# Patient Record
Sex: Male | Born: 1970 | Race: Black or African American | Hispanic: No | Marital: Married | Smoking: Current every day smoker
Health system: Southern US, Community
[De-identification: ages and names within clinical notes are randomized; demographics above are authoritative.]

## PROBLEM LIST (undated history)

## (undated) DIAGNOSIS — F329 Major depressive disorder, single episode, unspecified: Secondary | ICD-10-CM

## (undated) DIAGNOSIS — S92919A Unspecified fracture of unspecified toe(s), initial encounter for closed fracture: Secondary | ICD-10-CM

## (undated) DIAGNOSIS — E785 Hyperlipidemia, unspecified: Secondary | ICD-10-CM

## (undated) DIAGNOSIS — I10 Essential (primary) hypertension: Secondary | ICD-10-CM

## (undated) HISTORY — DX: Major depressive disorder, single episode, unspecified: F32.9

## (undated) HISTORY — DX: Hyperlipidemia, unspecified: E78.5

---

## 2009-01-12 ENCOUNTER — Emergency Department (HOSPITAL_COMMUNITY): Admission: EM | Admit: 2009-01-12 | Discharge: 2009-01-12 | Payer: Self-pay | Admitting: Emergency Medicine

## 2009-01-23 ENCOUNTER — Emergency Department (HOSPITAL_COMMUNITY): Admission: EM | Admit: 2009-01-23 | Discharge: 2009-01-23 | Payer: Self-pay | Admitting: Family Medicine

## 2009-10-11 ENCOUNTER — Emergency Department (HOSPITAL_COMMUNITY): Admission: EM | Admit: 2009-10-11 | Discharge: 2009-10-11 | Payer: Self-pay | Admitting: Family Medicine

## 2009-10-21 ENCOUNTER — Emergency Department (HOSPITAL_COMMUNITY): Admission: EM | Admit: 2009-10-21 | Discharge: 2009-10-21 | Payer: Self-pay | Admitting: Emergency Medicine

## 2009-11-06 ENCOUNTER — Emergency Department (HOSPITAL_COMMUNITY): Admission: EM | Admit: 2009-11-06 | Discharge: 2009-11-06 | Payer: Self-pay | Admitting: Family Medicine

## 2010-05-23 ENCOUNTER — Inpatient Hospital Stay (HOSPITAL_COMMUNITY)
Admission: RE | Admit: 2010-05-23 | Discharge: 2010-05-23 | Disposition: A | Discharge status: Home / Self Care | Payer: Self-pay | Source: Ambulatory Visit | Attending: Emergency Medicine | Admitting: Emergency Medicine

## 2010-05-23 DIAGNOSIS — T148XXA Other injury of unspecified body region, initial encounter: Secondary | ICD-10-CM

## 2010-07-08 LAB — WOUND CULTURE

## 2010-11-08 ENCOUNTER — Ambulatory Visit (INDEPENDENT_AMBULATORY_CARE_PROVIDER_SITE_OTHER): Payer: Self-pay

## 2010-11-08 ENCOUNTER — Inpatient Hospital Stay (INDEPENDENT_AMBULATORY_CARE_PROVIDER_SITE_OTHER)
Admission: RE | Admit: 2010-11-08 | Discharge: 2010-11-08 | Disposition: A | Discharge status: Home / Self Care | Payer: Self-pay | Source: Ambulatory Visit | Attending: Emergency Medicine | Admitting: Emergency Medicine

## 2010-11-08 DIAGNOSIS — L0291 Cutaneous abscess, unspecified: Secondary | ICD-10-CM

## 2010-11-08 DIAGNOSIS — S90129A Contusion of unspecified lesser toe(s) without damage to nail, initial encounter: Secondary | ICD-10-CM

## 2010-11-08 DIAGNOSIS — L0390 Cellulitis, unspecified: Secondary | ICD-10-CM

## 2011-10-17 ENCOUNTER — Emergency Department (HOSPITAL_COMMUNITY): Payer: Self-pay

## 2011-10-17 ENCOUNTER — Encounter (HOSPITAL_COMMUNITY): Payer: Self-pay | Admitting: *Deleted

## 2011-10-17 ENCOUNTER — Emergency Department (HOSPITAL_COMMUNITY)
Admission: EM | Admit: 2011-10-17 | Discharge: 2011-10-17 | Disposition: A | Discharge status: Home / Self Care | Payer: Self-pay | Attending: Emergency Medicine | Admitting: Emergency Medicine

## 2011-10-17 DIAGNOSIS — F17200 Nicotine dependence, unspecified, uncomplicated: Secondary | ICD-10-CM | POA: Insufficient documentation

## 2011-10-17 DIAGNOSIS — W268XXA Contact with other sharp object(s), not elsewhere classified, initial encounter: Secondary | ICD-10-CM | POA: Insufficient documentation

## 2011-10-17 DIAGNOSIS — Z23 Encounter for immunization: Secondary | ICD-10-CM | POA: Insufficient documentation

## 2011-10-17 DIAGNOSIS — S91339A Puncture wound without foreign body, unspecified foot, initial encounter: Secondary | ICD-10-CM

## 2011-10-17 DIAGNOSIS — S91309A Unspecified open wound, unspecified foot, initial encounter: Secondary | ICD-10-CM | POA: Insufficient documentation

## 2011-10-17 MED ORDER — SULFAMETHOXAZOLE-TRIMETHOPRIM 800-160 MG PO TABS: 1.0000 | ORAL_TABLET | Freq: Two times a day (BID) | ORAL | Status: AC

## 2011-10-17 MED ORDER — HYDROCODONE-ACETAMINOPHEN 5-500 MG PO TABS: 1.0000 | ORAL_TABLET | Freq: Four times a day (QID) | ORAL | Status: AC | PRN

## 2011-10-17 MED ORDER — CEPHALEXIN 250 MG PO CAPS: 250.0000 mg | ORAL_CAPSULE | Freq: Four times a day (QID) | ORAL | Status: AC

## 2011-10-17 MED ORDER — SULFAMETHOXAZOLE-TMP DS 800-160 MG PO TABS
1.0000 | ORAL_TABLET | Freq: Once | ORAL | Status: AC
  Administered 2011-10-17: 1 via ORAL
  Filled 2011-10-17: qty 1

## 2011-10-17 MED ORDER — TETANUS-DIPHTH-ACELL PERTUSSIS 5-2.5-18.5 LF-MCG/0.5 IM SUSP
0.5000 mL | Freq: Once | INTRAMUSCULAR | Status: AC
  Administered 2011-10-17: 0.5 mL via INTRAMUSCULAR
  Filled 2011-10-17: qty 0.5

## 2011-10-17 NOTE — ED Notes (Signed)
Pt states he step on nail. Pt states he has swelling in his left foot. Pt denies any other c/o

## 2011-10-17 NOTE — Discharge Instructions (Signed)
Puncture Wound  A puncture wound is an injury that extends through all layers of the skin and into the tissue beneath the skin (subcutaneous tissue). Puncture wounds become infected easily because germs often enter the body and go beneath the skin during the injury. Having a deep wound with a small entrance point makes it difficult for your caregiver to adequately clean the wound. This is especially true if you have stepped on a nail and it has passed through a dirty shoe or other situations where the wound is obviously contaminated.  CAUSES   Many puncture wounds involve glass, nails, splinters, fish hooks, or other objects that enter the skin (foreign bodies). A puncture wound may also be caused by a human bite or animal bite.  DIAGNOSIS   A puncture wound is usually diagnosed by your history and a physical exam. You may need to have an X-ray or an ultrasound to check for any foreign bodies still in the wound.  TREATMENT    Your caregiver will clean the wound as thoroughly as possible. Depending on the location of the wound, a bandage (dressing) may be applied.   Your caregiver might prescribe antibiotic medicines.   You may need a follow-up visit to check on your wound. Follow all instructions as directed by your caregiver.  HOME CARE INSTRUCTIONS    Change your dressing once per day, or as directed by your caregiver. If the dressing sticks, it may be removed by soaking the area in water.   If your caregiver has given you follow-up instructions, it is very important that you return for a follow-up appointment. Not following up as directed could result in a chronic or permanent injury, pain, and disability.   Only take over-the-counter or prescription medicines for pain, discomfort, or fever as directed by your caregiver.   If you are given antibiotics, take them as directed. Finish them even if you start to feel better.  You may need a tetanus shot if:   You cannot remember when you had your last tetanus  shot.   You have never had a tetanus shot.  If you got a tetanus shot, your arm may swell, get red, and feel warm to the touch. This is common and not a problem. If you need a tetanus shot and you choose not to have one, there is a rare chance of getting tetanus. Sickness from tetanus can be serious.  You may need a rabies shot if an animal bite caused your puncture wound.  SEEK MEDICAL CARE IF:    You have redness, swelling, or increasing pain in the wound.   You have red streaks going away from the wound.   You notice a bad smell coming from the wound or dressing.   You have yellowish-white fluid (pus) coming from the wound.   You are treated with an antibiotic for infection, but the infection is not getting better.   You notice something in the wound, such as rubber from your shoe, cloth, or another object.   You have a fever.   You have severe pain.   You have difficulty breathing.   You feel dizzy or faint.   You cannot stop vomiting.   You lose feeling, develop numbness, or cannot move a limb below the wound.   Your symptoms worsen.  MAKE SURE YOU:   Understand these instructions.   Will watch your condition.   Will get help right away if you are not doing well or get worse.    [facility].

## 2011-10-17 NOTE — ED Provider Notes (Signed)
History     CSN: [id number]  Arrival date & time 10/17/11  1818   First MD Initiated Contact with Patient 10/17/11 2136      Chief Complaint  Patient presents with  . Foot Pain    (Consider location/radiation/quality/duration/timing/severity/associated sxs/prior treatment) HPI  She presents to the emergency department with complaints of stepping on a nail earlier today. He states that his foot appears to be swollen therefore he decided to get it evaluated. He denies being a diabetic or denies having problems with stealing. He states that the nail was dirty. He is unsure when his last tetanus shot was. He denies having any nausea, vomiting, diarrhea, weakness, loss of consciousness.  History reviewed. No pertinent past medical history.  History reviewed. No pertinent past surgical history.  No family history on file.  History  Substance Use Topics  . Smoking status: Current Everyday Smoker  . Smokeless tobacco: Not on file  . Alcohol Use: Yes      Review of Systems   HEENT: denies blurry vision or change in hearing PULMONARY: Denies difficulty breathing and SOB CARDIAC: denies chest pain or heart palpitations MUSCULOSKELETAL:  denies being unable to ambulate ABDOMEN AL: denies abdominal pain GU: denies loss of bowel or urinary control NEURO: denies numbness and tingling in extremities      Allergies  Review of patient's allergies indicates no known allergies.  Home Medications   Current Outpatient Rx  Name Route Sig Dispense Refill  . CEPHALEXIN 250 MG PO CAPS Oral Take 1 capsule (250 mg total) by mouth 4 (four) times daily. 28 capsule 0  . HYDROCODONE-ACETAMINOPHEN 5-500 MG PO TABS Oral Take 1 tablet by mouth every 6 (six) hours as needed for pain. 8 tablet 0  . SULFAMETHOXAZOLE-TRIMETHOPRIM 800-160 MG PO TABS Oral Take 1 tablet by mouth every 12 (twelve) hours. 10 tablet 0    BP 149/83  Pulse 77  Temp 98.5 F (36.9 C) (Oral)  Resp 18  Ht 6\' 1"  (1.854  m)  Wt 350 lb (158.759 kg)  BMI 46.18 kg/m2  SpO2 95%  Physical Exam  Nursing note and vitals reviewed. Constitutional: He appears well-developed and well-nourished. No distress.  HENT:  Head: Normocephalic and atraumatic.  Eyes: Pupils are equal, round, and reactive to light.  Neck: Normal range of motion. Neck supple.  Cardiovascular: Normal rate and regular rhythm.   Pulmonary/Chest: Effort normal.  Abdominal: Soft.  Musculoskeletal:       Small puncture wound noted to bottom of patients foot. He does not have any cellulitis surrounding wound or puss when expressed. No foreign body palpated. No deformity. The wound is no longer bleeding. The foot is mildy swollen and mildly tender.  Neurological: He is alert.  Skin: Skin is warm and dry.    ED Course  Procedures (including critical care time)  Labs Reviewed - No data to display Dg Foot Complete Left  10/17/2011  *RADIOLOGY REPORT*  Clinical Data: 41 year old male with penetrating trauma.  Stepped on nail.  Pain.  LEFT FOOT - COMPLETE 3+ VIEW  Comparison: None.  Findings: Bone mineralization is within normal limits.  Chronic, corticated ossific fragment adjacent to the medial malleolus. Calcaneus intact.  Joint spaces preserved.  No acute fracture or dislocation.  No subcutaneous gas. No radiopaque foreign body identified.  IMPRESSION: No radiopaque foreign body identified.  No acute fracture or dislocation identified about the left foot.  Original Report Authenticated By: Harley Hallmark, M.D.     1. Puncture wound  of foot       MDM  pts foot soaked in betadine. Xrays negative for foreign body remaining in foot.Tetanus shot given. Crutches and post op boot. Pt given first dose of abx in ED as well as prescription keflex and bactrim. Pt also given a short course of pain medication. Asked to have wound rechecked by his PCP within the next 3-5 days.  Pt has been advised of the symptoms that warrant their return to the ED. Patient  has voiced understanding and has agreed to follow-up with the PCP or specialist.         Dorthula Matas, PA 10/17/11 2234

## 2011-10-18 NOTE — ED Provider Notes (Signed)
Medical screening examination/treatment/procedure(s) were performed by non-physician practitioner and as supervising physician I was immediately available for consultation/collaboration.    Takari Duncombe L Sian Rockers, MD 10/18/11 0005 

## 2012-05-01 ENCOUNTER — Encounter (HOSPITAL_COMMUNITY): Payer: Self-pay | Admitting: Family Medicine

## 2012-05-01 ENCOUNTER — Emergency Department (HOSPITAL_COMMUNITY)
Admission: EM | Admit: 2012-05-01 | Discharge: 2012-05-01 | Disposition: A | Discharge status: Home / Self Care | Payer: No Typology Code available for payment source | Attending: Emergency Medicine | Admitting: Emergency Medicine

## 2012-05-01 DIAGNOSIS — F17200 Nicotine dependence, unspecified, uncomplicated: Secondary | ICD-10-CM | POA: Insufficient documentation

## 2012-05-01 DIAGNOSIS — S0993XA Unspecified injury of face, initial encounter: Secondary | ICD-10-CM | POA: Insufficient documentation

## 2012-05-01 DIAGNOSIS — S199XXA Unspecified injury of neck, initial encounter: Secondary | ICD-10-CM | POA: Insufficient documentation

## 2012-05-01 DIAGNOSIS — Y92410 Unspecified street and highway as the place of occurrence of the external cause: Secondary | ICD-10-CM | POA: Insufficient documentation

## 2012-05-01 DIAGNOSIS — IMO0002 Reserved for concepts with insufficient information to code with codable children: Secondary | ICD-10-CM | POA: Insufficient documentation

## 2012-05-01 DIAGNOSIS — Y9389 Activity, other specified: Secondary | ICD-10-CM | POA: Insufficient documentation

## 2012-05-01 MED ORDER — NAPROXEN 500 MG PO TABS: 500.0000 mg | ORAL_TABLET | Freq: Two times a day (BID) | ORAL | Status: DC
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
## 2012-05-03 NOTE — ED Provider Notes (Signed)
Medical screening examination/treatment/procedure(s) were performed by non-physician practitioner and as supervising physician I was immediately available for consultation/collaboration.   Suzi Roots, MD 05/03/12 [phone]

## 2012-10-29 ENCOUNTER — Encounter (HOSPITAL_COMMUNITY): Payer: Self-pay | Admitting: Emergency Medicine

## 2012-10-29 ENCOUNTER — Emergency Department (HOSPITAL_COMMUNITY)
Admission: EM | Admit: 2012-10-29 | Discharge: 2012-10-29 | Disposition: A | Discharge status: Home / Self Care | Payer: Self-pay | Attending: Emergency Medicine | Admitting: Emergency Medicine

## 2012-10-29 DIAGNOSIS — R0981 Nasal congestion: Secondary | ICD-10-CM

## 2012-10-29 DIAGNOSIS — J00 Acute nasopharyngitis [common cold]: Secondary | ICD-10-CM | POA: Insufficient documentation

## 2012-10-29 DIAGNOSIS — F17200 Nicotine dependence, unspecified, uncomplicated: Secondary | ICD-10-CM | POA: Insufficient documentation

## 2012-10-29 DIAGNOSIS — J310 Chronic rhinitis: Secondary | ICD-10-CM

## 2012-10-29 DIAGNOSIS — J069 Acute upper respiratory infection, unspecified: Secondary | ICD-10-CM | POA: Insufficient documentation

## 2012-10-29 MED ORDER — DEXAMETHASONE SODIUM PHOSPHATE 10 MG/ML IJ SOLN
8.0000 mg | Freq: Once | INTRAMUSCULAR | Status: AC
  Administered 2012-10-29: 8 mg via INTRAMUSCULAR
  Filled 2012-10-29: qty 1

## 2012-10-29 MED ORDER — FLUTICASONE PROPIONATE 50 MCG/ACT NA SUSP: 2.0000 | Freq: Every day | NASAL | Status: DC

## 2012-10-29 MED ORDER — PREDNISONE 50 MG PO TABS: 50.0000 mg | ORAL_TABLET | Freq: Every day | ORAL | Status: DC

## 2012-10-29 MED ORDER — GUAIFENESIN ER 1200 MG PO TB12: 1.0000 | ORAL_TABLET | Freq: Two times a day (BID) | ORAL | Status: DC

## 2012-10-29 NOTE — ED Provider Notes (Signed)
   History    CSN: [id number] Arrival date & time 10/29/12  1017  First MD Initiated Contact with Patient 10/29/12 1028     Chief Complaint  Patient presents with  . Sinus Problem   (Consider location/radiation/quality/duration/timing/severity/associated sxs/prior Treatment) HPI Patient presents emergency department with nasal congestion for the last week.  Patient, states, that he's tried over-the-counter NyQuil and Alka-Seltzer.  Patient, states he did not get any relief.  Patient denies chest pain, shortness breath, nausea, vomiting, abdominal pain, neck pain, earache, headache, blurred vision, numbness, weakness, or syncope.  Patient, states he's not had any fevers.  Patient, states his symptoms have been constant History reviewed. No pertinent past medical history. History reviewed. No pertinent past surgical history. No family history on file. History  Substance Use Topics  . Smoking status: Current Every Day Smoker  . Smokeless tobacco: Not on file  . Alcohol Use: Yes    Review of Systems All other systems negative except as documented in the HPI. All pertinent positives and negatives as reviewed in the HPI. Allergies  Review of patient's allergies indicates no known allergies.  Home Medications  No current outpatient prescriptions on file. BP 157/100  Pulse 90  Temp(Src) 98.6 F (37 C)  Resp 24  SpO2 99% Physical Exam  Nursing note and vitals reviewed. Constitutional: He is oriented to person, place, and time. He appears well-developed and well-nourished. No distress.  HENT:  Head: Normocephalic and atraumatic.  Right Ear: Tympanic membrane normal.  Left Ear: Tympanic membrane normal.  Nose: Mucosal edema present. No sinus tenderness, nasal deformity or septal deviation. No epistaxis. Right sinus exhibits no maxillary sinus tenderness and no frontal sinus tenderness. Left sinus exhibits no maxillary sinus tenderness and no frontal sinus tenderness.  Mouth/Throat:  Uvula is midline, oropharynx is clear and moist and mucous membranes are normal. No oropharyngeal exudate, posterior oropharyngeal edema, posterior oropharyngeal erythema or tonsillar abscesses.  Eyes: Pupils are equal, round, and reactive to light.  Neck: Normal range of motion. Neck supple.  Cardiovascular: Normal rate, regular rhythm and normal heart sounds.  Exam reveals no gallop and no friction rub.   No murmur heard. Pulmonary/Chest: Effort normal and breath sounds normal. No respiratory distress. He has no wheezes.  Neurological: He is alert and oriented to person, place, and time.  Skin: Skin is warm and dry.    ED Course  Procedures (including critical care time) Patient retreated for URI with rhinitis and nasal congestion.  Patient is advised to return here as needed.  Told to increase his fluid intake, rest as much as possible.  Followup with his primary care Dr. or an urgent care  MDM    Carlyle Dolly, PA-C 10/29/12 1044   Medical screening examination/treatment/procedure(s) were performed by non-physician practitioner and as supervising physician I was immediately available for consultation/collaboration.  Derwood Kaplan, MD 10/30/12 [phone]

## 2012-10-29 NOTE — ED Notes (Signed)
Runny nose and facial pressure for the past few days, thick mucus states that he has facial pressure not getting any better.

## 2012-10-29 NOTE — Progress Notes (Signed)
[facility] has seen patient. Pt stated that he believed he had insurance through his job because they paid for his last ED visit to [facility]. Stated that he did not want to go to [facility] because [facility] was closer. Stated he received a card but did not bring it with him. Provided him with paperwork to send in card information and a list of primary care resources.

## 2013-12-21 DIAGNOSIS — F32A Depression, unspecified: Secondary | ICD-10-CM

## 2013-12-21 HISTORY — DX: Depression, unspecified: F32.A

## 2014-04-29 ENCOUNTER — Ambulatory Visit: Payer: Self-pay | Admitting: Internal Medicine

## 2014-05-17 ENCOUNTER — Ambulatory Visit: Payer: Self-pay | Admitting: Family Medicine

## 2014-06-07 ENCOUNTER — Encounter: Payer: Self-pay | Admitting: Family Medicine

## 2014-06-07 ENCOUNTER — Ambulatory Visit: Payer: Medicaid Other | Attending: Family Medicine | Admitting: Family Medicine

## 2014-06-07 VITALS — BP 131/82 | HR 78 | Temp 98.2°F | Resp 16 | Ht 73.0 in | Wt 311.8 lb

## 2014-06-07 DIAGNOSIS — F1411 Cocaine abuse, in remission: Secondary | ICD-10-CM | POA: Insufficient documentation

## 2014-06-07 DIAGNOSIS — Z6841 Body Mass Index (BMI) 40.0 and over, adult: Secondary | ICD-10-CM

## 2014-06-07 DIAGNOSIS — F1410 Cocaine abuse, uncomplicated: Secondary | ICD-10-CM | POA: Diagnosis not present

## 2014-06-07 DIAGNOSIS — Z113 Encounter for screening for infections with a predominantly sexual mode of transmission: Secondary | ICD-10-CM

## 2014-06-07 DIAGNOSIS — F329 Major depressive disorder, single episode, unspecified: Secondary | ICD-10-CM

## 2014-06-07 DIAGNOSIS — N529 Male erectile dysfunction, unspecified: Secondary | ICD-10-CM

## 2014-06-07 DIAGNOSIS — B356 Tinea cruris: Secondary | ICD-10-CM | POA: Diagnosis not present

## 2014-06-07 DIAGNOSIS — F1210 Cannabis abuse, uncomplicated: Secondary | ICD-10-CM | POA: Diagnosis not present

## 2014-06-07 DIAGNOSIS — Z720 Tobacco use: Secondary | ICD-10-CM

## 2014-06-07 DIAGNOSIS — F17200 Nicotine dependence, unspecified, uncomplicated: Secondary | ICD-10-CM | POA: Diagnosis not present

## 2014-06-07 DIAGNOSIS — F1010 Alcohol abuse, uncomplicated: Secondary | ICD-10-CM | POA: Diagnosis not present

## 2014-06-07 DIAGNOSIS — I10 Essential (primary) hypertension: Secondary | ICD-10-CM

## 2014-06-07 DIAGNOSIS — F1011 Alcohol abuse, in remission: Secondary | ICD-10-CM | POA: Insufficient documentation

## 2014-06-07 DIAGNOSIS — E66813 Obesity, class 3: Secondary | ICD-10-CM | POA: Insufficient documentation

## 2014-06-07 DIAGNOSIS — Z813 Family history of other psychoactive substance abuse and dependence: Secondary | ICD-10-CM | POA: Diagnosis not present

## 2014-06-07 DIAGNOSIS — F32A Depression, unspecified: Secondary | ICD-10-CM

## 2014-06-07 DIAGNOSIS — B351 Tinea unguium: Secondary | ICD-10-CM

## 2014-06-07 DIAGNOSIS — L918 Other hypertrophic disorders of the skin: Secondary | ICD-10-CM | POA: Diagnosis not present

## 2014-06-07 LAB — COMPLETE METABOLIC PANEL WITH GFR
ALBUMIN: 3.8 g/dL (ref 3.5–5.2)
ALT: 36 U/L (ref 0–53)
AST: 24 U/L (ref 0–37)
Alkaline Phosphatase: 62 U/L (ref 39–117)
BUN: 15 mg/dL (ref 6–23)
CALCIUM: 9.1 mg/dL (ref 8.4–10.5)
CHLORIDE: 104 meq/L (ref 96–112)
CO2: 26 meq/L (ref 19–32)
Creat: 1.12 mg/dL (ref 0.50–1.35)
GFR, Est African American: >89 mL/min
GFR, Est Non African American: 80 mL/min
GLUCOSE: 79 mg/dL (ref 70–99)
POTASSIUM: 5.4 meq/L — AB (ref 3.5–5.3)
SODIUM: 139 meq/L (ref 135–145)
Total Bilirubin: 0.2 mg/dL (ref 0.2–1.2)
Total Protein: 6.3 g/dL (ref 6.0–8.3)

## 2014-06-07 LAB — CBC
HCT: 47.4 % (ref 39.0–52.0)
Hemoglobin: 15.9 g/dL (ref 13.0–17.0)
MCH: 28.7 pg (ref 26.0–34.0)
MCHC: 33.5 g/dL (ref 30.0–36.0)
MCV: 85.6 fL (ref 78.0–100.0)
MPV: 9.4 fL (ref 8.6–12.4)
PLATELETS: 247 10*3/uL (ref 150–400)
RBC: 5.54 MIL/uL (ref 4.22–5.81)
RDW: 13.9 % (ref 11.5–15.5)
WBC: 6.2 10*3/uL (ref 4.0–10.5)

## 2014-06-07 LAB — POCT URINALYSIS DIPSTICK
BILIRUBIN UA: NEGATIVE
GLUCOSE UA: NEGATIVE
KETONES UA: NEGATIVE
Leukocytes, UA: NEGATIVE
Nitrite, UA: NEGATIVE
Protein, UA: NEGATIVE
RBC UA: NEGATIVE
SPEC GRAV UA: 1.025
Urobilinogen, UA: 0.2
pH, UA: 7

## 2014-06-07 LAB — LIPID PANEL
CHOL/HDL RATIO: 4.2 ratio
CHOLESTEROL: 195 mg/dL (ref 0–200)
HDL: 46 mg/dL (ref >39)
LDL Cholesterol: 72 mg/dL (ref 0–99)
TRIGLYCERIDES: 387 mg/dL — AB (ref <150)
VLDL: 77 mg/dL — AB (ref 0–40)

## 2014-06-07 LAB — TSH: TSH: 1.158 u[IU]/mL (ref 0.350–4.500)

## 2014-06-07 LAB — POCT GLYCOSYLATED HEMOGLOBIN (HGB A1C): Hemoglobin A1C: 5.6

## 2014-06-07 MED ORDER — SILDENAFIL CITRATE 100 MG PO TABS: 50.0000 mg | ORAL_TABLET | Freq: Every day | ORAL | Status: DC | PRN

## 2014-06-07 NOTE — Assessment & Plan Note (Signed)
A: skin tags  P: f/u for skin tag removals

## 2014-06-07 NOTE — Assessment & Plan Note (Signed)
A: mild rash today P: lamisil for toenails will also cover this

## 2014-06-07 NOTE — Patient Instructions (Signed)
Mr. Leeth,  Thank you for coming in today. It was a pleasure meeting you. I look forward to being your primary doctor.   1. Erectile dysfunction: Normal blood sugar, blood pressure, blood flow  I suspect your dysfunction is due to the following: alcohol abuse, smoking, intermittent cocaine use  Plan: viagra 50-100 mg as needed, look into pfizer pathway or online for coupons assistance with cost if needed  Continue to avoid alcohol and drugs. Continue wellbutrin which is good for depression and smoking cessation.   2. Toe nail fungus and jock itch:  Checking liver function test (CMP) and CBC If normal will order lamisil 250 mg daily for 12 weeks.   3. Skin tags: plan to remove at f/u visit  4 smoker:  Normal heart and lung exam today Please have chest x-ray done at earliest convenience  You will be called with lab and imaging results  F/u in 3-4 weeks for skin tag removal  Dr. Adrian Blackwater

## 2014-06-07 NOTE — Progress Notes (Signed)
Patient reports he has no energy and would like to get his testosterone levels checkes Patient reports depression and is taking medication for it but is not sure the name or dose Patient reports erectile dysfunction Patient also reports "jock itch"

## 2014-06-07 NOTE — Progress Notes (Signed)
   Subjective:    Patient ID: Bruce Koch, male    DOB: 1970/07/18, 44 y.o.   MRN: [id number] CC: establish care, low energy, depression, erectile dysfunction, "jock itch" HPI 44 yo M presents to establish care and discuss the following:  1. Depression: Started in July 2015 after losing his job at [employer]. Patient started drinking more heavily in 10/15 he went on a cocaine binge. He since went to [facility]. He has stopped drinking for 3 weeks. He has smoked cocaine 2-3 times since 10/15. He reports that his neighbor sells it. He is taking Wellbutrin, cannot recall the dose. He thinks he is taking naltrexone as well. He reports depression symptoms have persisted and are mildly improved, to no change. No SI.   2. Substance abuse: ETOH, marijuana and cocaine. Has a strong fam hx of substance abuse and his father died of cocaine overdose. He feels like he needs more treatment than [facility] can offer. He is working to get into [facility].   3. Erectile dysfunction: x 6 months. His wife is complaining. He has difficulty getting and maintaining erection. He is a smoker. Has had elevated BP in the past but is not hypertensive.   4. Rash: in groin area x 10 years.  Very itchy at times. Also gets rash between toes. Has tried creams, ointments and powders. Rash is doing well today.  Soc Hx: current smoker 1/4-1 PPD  Med Hx: ETOH abuse and cocaine abuse Surg hx: negative  Review of Systems As per HPI GAD-7: score of 14. 1-1,4,5. 2-2. 3-3,6,7.     Objective:   Physical Exam BP 131/82 mmHg  Pulse 78  Temp(Src) 98.2 F (36.8 C)  Resp 16  Ht 6' 1"  (1.854 m)  Wt 311 lb 12.8 oz (141.432 kg)  BMI 41.15 kg/m2  SpO2 96% General appearance: alert, cooperative, no distress and morbidly obese Head: Normocephalic, without obvious abnormality, atraumatic Eyes: conjunctivae/corneas clear. PERRL, EOM's intact. Ears: normal TM's and external ear canals both ears Nose: no discharge, turbinates  pink, swollen Throat: lips, mucosa, and tongue normal; fair dentition, gums normal  Neck: no adenopathy and thyroid not enlarged, symmetric, no tenderness/mass/nodules Lungs: clear to auscultation bilaterally Heart: regular rate and rhythm, S1, S2 normal, no murmur, click, rub or gallop Male genitalia:circumcised, no skin lesions, normal testes, no hernias. 2+ femoral pulses.  Extremities: extremities normal, atraumatic, no cyanosis or edema Skin: atrophic skin in groin area, dry skin between toes. Thickened and yellow toes on L foot 1st and 5th as well as R foot 1st, 2nd, 5th. Skin tags under arms and around neck.   Lab Results  Component Value Date   HGBA1C 5.60 06/07/2014   UA: wnl      Assessment & Plan:

## 2014-06-07 NOTE — Assessment & Plan Note (Signed)
Screening HIV and RPR

## 2014-06-07 NOTE — Assessment & Plan Note (Signed)
A: ED due to substance abuse and depression most likely  P: Smoking cessation Avoid ETOH and cocaine  viagra

## 2014-06-07 NOTE — Assessment & Plan Note (Signed)
A: toenail fungus P: CMP, CBC, lamisil x 12 week course if labs wnl

## 2014-06-07 NOTE — Assessment & Plan Note (Signed)
A: depressed mood and polysubstance abuse P: Patient advised to continue antidepressant and care under therapy. He may go to [place] or [place] for additional services if needed

## 2014-06-08 LAB — RPR

## 2014-06-08 LAB — HIV ANTIBODY (ROUTINE TESTING W REFLEX): HIV: NONREACTIVE

## 2014-06-08 LAB — TESTOSTERONE: TESTOSTERONE: 274 ng/dL — AB (ref 300–890)

## 2014-06-09 ENCOUNTER — Telehealth: Payer: Self-pay | Admitting: *Deleted

## 2014-06-09 NOTE — Telephone Encounter (Signed)
LVM to return call.

## 2014-06-09 NOTE — Telephone Encounter (Signed)
-----   Message from Minerva Ends, MD sent at 06/08/2014  2:31 PM EST ----- Slightly low testosterone level. High non-fasting Tgs, will repeat fasting, next visit Slight elevated K+ with normal Cr (kidney function) no w/u needed at this time

## 2014-06-24 ENCOUNTER — Ambulatory Visit: Payer: Medicaid Other

## 2014-07-11 ENCOUNTER — Ambulatory Visit: Payer: Medicaid Other | Admitting: Family Medicine

## 2014-07-14 ENCOUNTER — Ambulatory Visit: Payer: Medicaid Other | Admitting: Family Medicine

## 2014-08-09 ENCOUNTER — Encounter: Payer: Self-pay | Admitting: Family Medicine

## 2014-08-09 ENCOUNTER — Ambulatory Visit: Payer: Medicaid Other | Attending: Family Medicine | Admitting: Family Medicine

## 2014-08-09 VITALS — BP 145/95 | HR 78 | Temp 98.4°F | Ht 73.0 in | Wt 308.0 lb

## 2014-08-09 DIAGNOSIS — N529 Male erectile dysfunction, unspecified: Secondary | ICD-10-CM | POA: Insufficient documentation

## 2014-08-09 DIAGNOSIS — K219 Gastro-esophageal reflux disease without esophagitis: Secondary | ICD-10-CM | POA: Diagnosis not present

## 2014-08-09 DIAGNOSIS — B356 Tinea cruris: Secondary | ICD-10-CM | POA: Diagnosis not present

## 2014-08-09 DIAGNOSIS — B353 Tinea pedis: Secondary | ICD-10-CM | POA: Diagnosis not present

## 2014-08-09 DIAGNOSIS — R350 Frequency of micturition: Secondary | ICD-10-CM | POA: Insufficient documentation

## 2014-08-09 LAB — POCT URINALYSIS DIPSTICK
Bilirubin, UA: NEGATIVE
GLUCOSE UA: NEGATIVE
Ketones, UA: NEGATIVE
Leukocytes, UA: NEGATIVE
Nitrite, UA: NEGATIVE
PH UA: 5.5
PROTEIN UA: NEGATIVE
RBC UA: NEGATIVE
SPEC GRAV UA: 1.025
UROBILINOGEN UA: 0.2

## 2014-08-09 MED ORDER — PRAZOSIN HCL 1 MG PO CAPS: 1.0000 mg | ORAL_CAPSULE | Freq: Two times a day (BID) | ORAL | Status: DC

## 2014-08-09 MED ORDER — TADALAFIL 20 MG PO TABS: 10.0000 mg | ORAL_TABLET | ORAL | Status: DC | PRN

## 2014-08-09 MED ORDER — PANTOPRAZOLE SODIUM 40 MG PO TBEC: 40.0000 mg | DELAYED_RELEASE_TABLET | Freq: Every day | ORAL | Status: DC

## 2014-08-09 MED ORDER — TERBINAFINE HCL 250 MG PO TABS: 250.0000 mg | ORAL_TABLET | Freq: Every day | ORAL | Status: DC

## 2014-08-09 NOTE — Assessment & Plan Note (Signed)
A: Frequent urination: suspect BPH. Patient declined prostate exam. Normal UA. Slightly elevated BP P: Avoid liquids after 9 AM Avoid sugar sweetened drinks Prazosin 1 mg twice daily to help reduce prostate size. It will also lower your BP a bit.

## 2014-08-09 NOTE — Assessment & Plan Note (Signed)
Sexual dysfunction:  Try cialis since viagra was too expensive  Regular exercise  Low carb diet with focus on lean protein

## 2014-08-09 NOTE — Assessment & Plan Note (Signed)
Fungal infection in groin and feet: Lamisil 250 mg daily for 12 weeks

## 2014-08-09 NOTE — Assessment & Plan Note (Addendum)
Fungal infection in groin and feet: Lamisil 250 mg daily for 12 weeks

## 2014-08-09 NOTE — Patient Instructions (Signed)
Mr. Rouillard,  Thank you for coming in today.  1. GERD Take Protonix 30 minutes before meals   2. Fungal infection in groin and feet: Lamisil 250 mg daily for 12 weeks  3. Frequent urination: Avoid liquids after 9 AM Avoid sugar sweetened drinks Prazosin 1 mg twice daily to help reduce prostate size. It will also lower your BP a bit.  4. Sexual dysfunction:  Try cialis since viagra was too expensive  Regular exercise  Low carb diet with focus on lean protein  F/u in 3 months for fungus  Dr. Adrian Blackwater  Food Choices for Gastroesophageal Reflux Disease When you have gastroesophageal reflux disease (GERD), the foods you eat and your eating habits are very important. Choosing the right foods can help ease the discomfort of GERD. WHAT GENERAL GUIDELINES DO I NEED TO FOLLOW?  Choose fruits, vegetables, whole grains, low-fat dairy products, and low-fat meat, fish, and poultry.  Limit fats such as oils, salad dressings, butter, nuts, and avocado.  Keep a food diary to identify foods that cause symptoms.  Avoid foods that cause reflux. These may be different for different people.  Eat frequent small meals instead of three large meals each day.  Eat your meals slowly, in a relaxed setting.  Limit fried foods.  Cook foods using methods other than frying.  Avoid drinking alcohol.  Avoid drinking large amounts of liquids with your meals.  Avoid bending over or lying down until 2-3 hours after eating. WHAT FOODS ARE NOT RECOMMENDED? The following are some foods and drinks that may worsen your symptoms: Vegetables Tomatoes. Tomato juice. Tomato and spaghetti sauce. Chili peppers. Onion and garlic. Horseradish. Fruits Oranges, grapefruit, and lemon (fruit and juice). Meats High-fat meats, fish, and poultry. This includes hot dogs, ribs, ham, sausage, salami, and bacon. Dairy Whole milk and chocolate milk. Sour cream. Cream. Butter. Ice cream. Cream cheese.  Beverages Coffee and  tea, with or without caffeine. Carbonated beverages or energy drinks. Condiments Hot sauce. Barbecue sauce.  Sweets/Desserts Chocolate and cocoa. Donuts. Peppermint and spearmint. Fats and Oils High-fat foods, including [place] fries and potato chips. Other Vinegar. Strong spices, such as black pepper, white pepper, red pepper, cayenne, curry powder, cloves, ginger, and chili powder. The items listed above may not be a complete list of foods and beverages to avoid. Contact your dietitian for more information. Document Released: 04/08/2005 Document Revised: 04/13/2013 Document Reviewed: 02/10/2013 [facility] Patient Information 2015 [place], [place]. This information is not intended to replace advice given to you by your health care provider. Make sure you discuss any questions you have with your health care provider.

## 2014-08-09 NOTE — Progress Notes (Signed)
   Subjective:    Patient ID: Bruce Koch, male    DOB: [birth year], 44 y.o.   MRN: [id number] CC: GERD, urinary frequency, tinea pedis and cruris  HPI  1. GERD: eating cake and cereal late at night. Having reflux. Burning sensation. Eats at night because he does not feel full.  2. Foot and groin fungus: tried OTC creams. No improvement. Has a lot of itching in groin and between toes.   3. Urinary frequency: 2-3 times per night wakes up to urinate. Drinks a lot of liquid mostly milk, juice, soda during the evening. ETOH once a week only.   4. Erectile dysfunction: has slightly low testosterone. Could no afford viagra. Would like to try another medication.   Soc Hx: current smoker 1/4 PPD. ETOH 1 x per week. No cocaine.  Review of Systems  Constitutional: Negative for fever.  Gastrointestinal: Positive for nausea, vomiting and abdominal pain.  Endocrine: Positive for polyuria.  Genitourinary: Positive for frequency. Negative for dysuria and flank pain.      Objective:   Physical Exam BP 145/95 mmHg  Pulse 78  Temp(Src) 98.4 F (36.9 C) (Oral)  Ht 6' 1"  (1.854 m)  Wt 308 lb (139.708 kg)  BMI 40.64 kg/m2  SpO2 98%  BP Readings from Last 3 Encounters:  08/09/14 145/95  06/07/14 131/82  10/29/12 147/85   Wt Readings from Last 3 Encounters:  08/09/14 308 lb (139.708 kg)  06/07/14 311 lb 12.8 oz (141.432 kg)  10/17/11 350 lb (158.759 kg)  General appearance: alert, cooperative, no distress and morbidly obese Abdomen: soft, non-tender; bowel sounds normal; no masses,  no organomegaly Male genitalia: normal  Rectal: patient refused  Extremities: fissuring and thickening of skin between toes   Lab Results  Component Value Date   HGBA1C 5.60 06/07/2014   UA: normal     Assessment & Plan:

## 2014-08-09 NOTE — Assessment & Plan Note (Signed)
A: GERD P: Take Protonix 30 minutes before meals  GERD diet

## 2014-08-09 NOTE — Progress Notes (Signed)
Patient complains of abdominal pain for 2-3 weeks.  Frequent bathroom trips, gas mostly. Patient also having trouble with skin irritation/jock itch.  Some odor.

## 2014-09-09 ENCOUNTER — Ambulatory Visit: Payer: Medicaid Other

## 2014-09-14 ENCOUNTER — Encounter (HOSPITAL_COMMUNITY): Payer: Self-pay | Admitting: Emergency Medicine

## 2014-09-14 ENCOUNTER — Emergency Department (HOSPITAL_COMMUNITY)
Admission: EM | Admit: 2014-09-14 | Discharge: 2014-09-14 | Disposition: A | Discharge status: Home / Self Care | Payer: Medicaid Other | Attending: Emergency Medicine | Admitting: Emergency Medicine

## 2014-09-14 DIAGNOSIS — Z79899 Other long term (current) drug therapy: Secondary | ICD-10-CM | POA: Insufficient documentation

## 2014-09-14 DIAGNOSIS — Z720 Tobacco use: Secondary | ICD-10-CM | POA: Diagnosis not present

## 2014-09-14 DIAGNOSIS — Z8659 Personal history of other mental and behavioral disorders: Secondary | ICD-10-CM | POA: Insufficient documentation

## 2014-09-14 DIAGNOSIS — H578 Other specified disorders of eye and adnexa: Secondary | ICD-10-CM | POA: Insufficient documentation

## 2014-09-14 DIAGNOSIS — H579 Unspecified disorder of eye and adnexa: Secondary | ICD-10-CM

## 2014-09-14 MED ORDER — FLUORESCEIN SODIUM 1 MG OP STRP
1.0000 | ORAL_STRIP | Freq: Once | OPHTHALMIC | Status: AC
  Administered 2014-09-14: 1 via OPHTHALMIC
  Filled 2014-09-14: qty 1

## 2014-09-14 MED ORDER — TETRACAINE HCL 0.5 % OP SOLN
1.0000 [drp] | Freq: Once | OPHTHALMIC | Status: AC
  Administered 2014-09-14: 1 [drp] via OPHTHALMIC
  Filled 2014-09-14: qty 2

## 2014-09-14 NOTE — Discharge Instructions (Signed)
Eye, Foreign Body sensation You can use normal saline eye drops.  The term foreign body refers to any object near, on the surface of or in the eye that should not be there. A foreign body may be a small speck of dirt or dust, a hair or eyelash, a splinter or any object. CAUSES  Foreign bodies can get in the eye by:  Flying pieces of something that was broken or destroyed (debris).  A sudden injury (trauma) to the eye. SYMPTOMS  Symptoms depend on what the foreign body is and where it is in the eye. The most common locations are:  On the inner surface of the upper or lower eyelids or on the covering of the white part of the eye (conjunctiva). Symptoms in this location are:  Irritating and painful, especially when blinking.  Feeling like something is in the eye.  On the surface of the clear covering on the front of the eye (cornea). A corneal foreign body has symptoms that:  Are painful and irritating since the cornea is very sensitive.  Form small "rust rings" around a metallic foreign body. Metallic foreign bodies stick more firmly to the surface of the cornea.  Inside the eyeball. Infection can happen fast and can be hard to treat with antibiotics. This is an extremely dangerous situation. Foreign bodies inside the eye can threaten vision. A person may even loose their eye. Foreign bodies inside the eye may cause:  Great pain.  Immediate loss of vision. DIAGNOSIS  Foreign bodies are found during an exam by an eye specialist. Those that are on the eyelids, conjunctiva or cornea are usually (but not always) easily found. When a foreign body is inside the eyeball, a cataract may form almost right away. This makes it hard for an ophthalmologist to find the foreign body. Special tests may be needed, including ultrasound testing, X-rays and CT scans. TREATMENT   Foreign bodies that are on the eyelids, conjunctiva or cornea are often removed easily and painlessly.  If the foreign body  has caused a scratch or abrasion of the cornea, antibiotic drops, ointments and/or a tight patch called a "pressure patch" may be needed. Follow-up exams will be needed for several days until the abrasion heals.  Surgery is needed right away if the foreign body is inside the eyeball. This is a medical emergency. An antibiotic therapy will likely be given to stop an infection. HOME CARE INSTRUCTIONS  The use of eye patches is not universal. Their use varies from state to state and from caregiver to caregiver. If an eye patch was applied:  Keep the eye patch on for as long as directed by your caregiver until the follow-up appointment.  Do not remove the patch to put in medications unless instructed to do so. When replacing the patch, retape it as it was before. Follow the same procedure if the patch becomes loose.  WARNING: Do not drive or operate machinery while the eye is patched. The ability to judge distances will be impaired.  Only take over-the-counter or prescription medicines for pain, discomfort or fever as directed by the caregiver. If no eye patch was applied:  Keep the eye closed as much as possible. Do not rub the eye.  Wear dark glasses as needed to protect the eyes from bright light.  Do not wear contact lenses until the eye feels normal again, or as instructed.  Wear protective eye covering if there is a risk of eye injury. This is important when working  with high speed tools.  Only take over-the-counter or prescription medicines for pain, discomfort or fever as directed by the caregiver. SEEK IMMEDIATE MEDICAL CARE IF:   Pain increases in the eye or the vision changes.  You or your child has problems with the eye patch.  The injury to the eye appears to be getting larger.  There is discharge from the injured eye.  Swelling and/or soreness (inflammation) develops around the affected eye.  You or your child has an oral temperature above 102 F (38.9 C), not  controlled by medicine.  Your baby is older than 3 months with a rectal temperature of 102 F (38.9 C) or higher.  Your baby is 60 months old or younger with a rectal temperature of 100.4 F (38 C) or higher. MAKE SURE YOU:   Understand these instructions.  Will watch your condition.  Will get help right away if you are not doing well or get worse. Document Released: 04/08/2005 Document Revised: 07/01/2011 Document Reviewed: 09/03/2012 [facility] Patient Information 2015 [place], [place]. This information is not intended to replace advice given to you by your health care provider. Make sure you discuss any questions you have with your health care provider.

## 2014-09-14 NOTE — ED Notes (Signed)
Tetracaine and fluorescein at bedside for EDPA

## 2014-09-14 NOTE — ED Notes (Signed)
Pt A+Ox4, reports was mowing the grass yesterday and started having a R eye problem after.  C/o pain, swelling to area "something in my eyelid".  6/10.  +swelling noted.  Skin otherwise PWD.  MAEI.  Speaking full/clear sentences, rr even/un-lab.  Ambulatory with steady gait.  NAD.

## 2014-09-14 NOTE — ED Provider Notes (Signed)
CSN: [id number]     Arrival date & time 09/14/14  1133 History  This chart was scribed for non-physician practitioner Waynetta Pean, PA-C, working with Dorie Rank, MD, by Thea Alken, ED Scribe. This patient was seen in room WTR7/WTR7 and the patient's care was started at 12:00 PM.   Chief Complaint  Patient presents with  . Eye Problem    "was moving the grass yesterday and something got in my eye"   The history is provided by the patient. No language interpreter was used.   Braison Snoke is a 44 y.o. male who presents to the Emergency Department complaining right eye irritation onset yesterday. Pt states he was mowing his lawn yesterday when a possible foreign body flew into his right eye. He reports irritation that began later that evening with some teary watery drainage. He now has 6/10 right eye pain, with the sensation of a localized foreign body under right eye lid. Pt has tried visine with minimal relief as well as rubbing his eye. Pt denies wearing contacts or glasses. He denies blurry vision.   Past Medical History  Diagnosis Date  . Depression 12/2013   History reviewed. No pertinent past surgical history. Family History  Problem Relation Age of Onset  . Diabetes Father   . Drug abuse Father     cocaine overdose   . Alcohol abuse Mother     history   . Alcohol abuse Sister   . Alcohol abuse Brother    History  Substance Use Topics  . Smoking status: Current Every Day Smoker -- 0.25 packs/day  . Smokeless tobacco: Never Used  . Alcohol Use: Yes     Comment: 1/5 Vodka per week     Review of Systems  Constitutional: Negative for fever.  HENT: Negative for ear pain.   Eyes: Positive for pain, discharge and redness. Negative for photophobia, itching and visual disturbance.   Allergies  Review of patient's allergies indicates no known allergies.  Home Medications   Prior to Admission medications   Medication Sig Start Date End Date Taking? Authorizing Provider   pantoprazole (PROTONIX) 40 MG tablet Take 1 tablet (40 mg total) by mouth daily before supper. 08/09/14   Josalyn Funches, MD  prazosin (MINIPRESS) 1 MG capsule Take 1 capsule (1 mg total) by mouth 2 (two) times daily. 08/09/14   Josalyn Funches, MD  tadalafil (CIALIS) 20 MG tablet Take 0.5-1 tablets (10-20 mg total) by mouth every other day as needed for erectile dysfunction. 08/09/14   Josalyn Funches, MD  terbinafine (LAMISIL) 250 MG tablet Take 1 tablet (250 mg total) by mouth daily. For 12 weeks 08/09/14   Josalyn Funches, MD   BP 143/83 mmHg  Pulse 76  Temp(Src) 98.4 F (36.9 C) (Oral)  Resp 16  SpO2 96% Physical Exam  Constitutional: He appears well-developed and well-nourished. No distress.  HENT:  Head: Normocephalic and atraumatic.  Right Ear: External ear normal.  Left Ear: External ear normal.  Eyes: EOM are normal. Pupils are equal, round, and reactive to light. Lids are everted and swept, no foreign bodies found. Right eye exhibits discharge. Right eye exhibits no chemosis. No foreign body present in the right eye. Left eye exhibits no chemosis and no discharge. Right conjunctiva is injected ( mild). Right conjunctiva has no hemorrhage. Left conjunctiva is not injected. Left conjunctiva has no hemorrhage. Right eye exhibits normal extraocular motion. Left eye exhibits normal extraocular motion.  Slit lamp exam:      The right  eye shows no corneal abrasion, no corneal flare and no foreign body.  Right eye tearing noted initially. Right eye conjunctival injection. I anesthetized with tetracaine and stained with fluorescein. No sign of corneal abrasion. Right lids are inverted and swept and no foreign body noted. Right eye flushed with normal saline.   Neck: Neck supple.  Cardiovascular: Normal rate.   Pulmonary/Chest: Effort normal. No respiratory distress.  Musculoskeletal: Normal range of motion.  Neurological: He is alert. Coordination normal.  Skin: Skin is warm and dry. He is  not diaphoretic.  Psychiatric: He has a normal mood and affect. His behavior is normal.  Nursing note and vitals reviewed.   ED Course  Procedures  DIAGNOSTIC STUDIES: Oxygen Saturation is 96% on RA, normal by my interpretation.    COORDINATION OF CARE: 12:10 PM- Pt advised of plan for treatment and pt agrees.  Labs Review Labs Reviewed - No data to display  Imaging Review No results found.   EKG Interpretation None      Filed Vitals:   09/14/14 1135  BP: 143/83  Pulse: 76  Temp: 98.4 F (36.9 C)  TempSrc: Oral  Resp: 16  SpO2: 96%     MDM   Meds given in ED:  Medications  fluorescein ophthalmic strip 1 strip (1 strip Right Eye Given 09/14/14 1219)  tetracaine (PONTOCAINE) 0.5 % ophthalmic solution 1 drop (1 drop Right Eye Given 09/14/14 1219)    Discharge Medication List as of 09/14/2014 12:23 PM      Final diagnoses:  Sensation of foreign body in eye   This is a 44 year old male who presented to the emergency department complaining of a foreign body sensation in his right eye after mowing the lawn yesterday. Patient's eye stained with fluorescein and no evidence of corneal abrasion noted. Patient's right eye was flushed with normal saline and lids were inverted and swept her foreign bodies. No foreign body was found. Patient reports much relief after normal saline flush. I advised the patient to use normal saline eyedrops at home as needed for continued pain and to follow-up with ophthalmology if he has continued problems. I advised the patient to follow-up with their primary care provider this week. I advised the patient to return to the emergency department with new or worsening symptoms or new concerns. The patient verbalized understanding and agreement with plan.    I personally performed the services described in this documentation, which was scribed in my presence. The recorded information has been reviewed and is accurate.     Waynetta Pean,  PA-C 09/14/14 1727  Dorie Rank, MD 09/15/14 [phone]

## 2014-11-01 ENCOUNTER — Ambulatory Visit: Payer: Medicaid Other | Admitting: Family Medicine

## 2015-01-06 ENCOUNTER — Encounter (HOSPITAL_COMMUNITY): Payer: Self-pay | Admitting: *Deleted

## 2015-01-06 ENCOUNTER — Emergency Department (HOSPITAL_COMMUNITY)
Admission: EM | Admit: 2015-01-06 | Discharge: 2015-01-06 | Disposition: A | Discharge status: Home / Self Care | Payer: Medicaid Other | Attending: Emergency Medicine | Admitting: Emergency Medicine

## 2015-01-06 DIAGNOSIS — R51 Headache: Secondary | ICD-10-CM | POA: Diagnosis present

## 2015-01-06 DIAGNOSIS — F329 Major depressive disorder, single episode, unspecified: Secondary | ICD-10-CM | POA: Diagnosis not present

## 2015-01-06 DIAGNOSIS — J029 Acute pharyngitis, unspecified: Secondary | ICD-10-CM | POA: Insufficient documentation

## 2015-01-06 DIAGNOSIS — Z720 Tobacco use: Secondary | ICD-10-CM | POA: Insufficient documentation

## 2015-01-06 DIAGNOSIS — B029 Zoster without complications: Secondary | ICD-10-CM | POA: Diagnosis not present

## 2015-01-06 DIAGNOSIS — Z79899 Other long term (current) drug therapy: Secondary | ICD-10-CM | POA: Diagnosis not present

## 2015-01-06 MED ORDER — TETRACAINE HCL 0.5 % OP SOLN
2.0000 [drp] | Freq: Once | OPHTHALMIC | Status: AC
  Administered 2015-01-06: 2 [drp] via OPHTHALMIC
  Filled 2015-01-06: qty 2

## 2015-01-06 MED ORDER — ACYCLOVIR 200 MG PO CAPS
400.0000 mg | ORAL_CAPSULE | Freq: Once | ORAL | Status: AC
  Administered 2015-01-06: 400 mg via ORAL
  Filled 2015-01-06: qty 2

## 2015-01-06 MED ORDER — IBUPROFEN 400 MG PO TABS
800.0000 mg | ORAL_TABLET | Freq: Once | ORAL | Status: AC
  Administered 2015-01-06: 800 mg via ORAL
  Filled 2015-01-06: qty 2

## 2015-01-06 MED ORDER — ACYCLOVIR 400 MG PO TABS: 400.0000 mg | ORAL_TABLET | Freq: Four times a day (QID) | ORAL | Status: DC

## 2015-01-06 MED ORDER — OXYCODONE-ACETAMINOPHEN 5-325 MG PO TABS: 2.0000 | ORAL_TABLET | ORAL | Status: DC | PRN

## 2015-01-06 MED ORDER — FLUORESCEIN SODIUM 1 MG OP STRP
1.0000 | ORAL_STRIP | Freq: Once | OPHTHALMIC | Status: AC
  Administered 2015-01-06: 1 via OPHTHALMIC
  Filled 2015-01-06: qty 1

## 2015-01-06 NOTE — ED Notes (Signed)
Rona Ravens, PA at bedside.

## 2015-01-06 NOTE — Discharge Instructions (Signed)

## 2015-01-06 NOTE — ED Provider Notes (Signed)
CSN: [id number]     Arrival date & time 01/06/15  2100 History  This chart was scribed for non-physician practitioner Domenic Moras, PA-C working with Ripley Fraise, MD by Meriel Pica, ED Scribe. This patient was seen in room TR11C/TR11C and the patient's care was started at 9:15 PM.    Chief Complaint  Patient presents with  . Headache  . Rash  . Sore Throat   The history is provided by the patient. No language interpreter was used.   HPI Comments: Bruce Koch is a 44 y.o. male who presents to the Emergency Department complaining of a sudden onset, progressively worsening, pruritic and erythematous rash to left side of face that is painful onset 1 day ago. He reports an associated left-sided headache and left-sided sore throat. Pt has been taking ibuprofen with mild to no relief. Sick contacts at home; children have URI. Denies a history of shingles, pain with eye movement or vision problems, dental pain, CP, or SOB.   Past Medical History  Diagnosis Date  . Depression 12/2013   History reviewed. No pertinent past surgical history. Family History  Problem Relation Age of Onset  . Diabetes Father   . Drug abuse Father     cocaine overdose   . Alcohol abuse Mother     history   . Alcohol abuse Sister   . Alcohol abuse Brother    Social History  Substance Use Topics  . Smoking status: Current Every Day Smoker -- 0.25 packs/day  . Smokeless tobacco: Never Used  . Alcohol Use: Yes     Comment: rarely     Review of Systems  HENT: Positive for sore throat ( left-sided). Negative for dental problem.   Eyes: Negative for photophobia, pain and visual disturbance.  Respiratory: Negative for shortness of breath.   Cardiovascular: Negative for chest pain.  Skin: Positive for rash ( left side of face).  Neurological: Positive for headaches ( left-sided).    Allergies  Review of patient's allergies indicates no known allergies.  Home Medications   Prior to Admission  medications   Medication Sig Start Date End Date Taking? Authorizing Provider  pantoprazole (PROTONIX) 40 MG tablet Take 1 tablet (40 mg total) by mouth daily before supper. 08/09/14   Josalyn Funches, MD  prazosin (MINIPRESS) 1 MG capsule Take 1 capsule (1 mg total) by mouth 2 (two) times daily. 08/09/14   Josalyn Funches, MD  tadalafil (CIALIS) 20 MG tablet Take 0.5-1 tablets (10-20 mg total) by mouth every other day as needed for erectile dysfunction. 08/09/14   Josalyn Funches, MD  terbinafine (LAMISIL) 250 MG tablet Take 1 tablet (250 mg total) by mouth daily. For 12 weeks 08/09/14   Josalyn Funches, MD   BP 140/97 mmHg  Pulse 96  Temp(Src) 98.9 F (37.2 C) (Oral)  Resp 20  Ht 6' 1"  (1.854 m)  Wt 275 lb (124.739 kg)  BMI 36.29 kg/m2  SpO2 98% Physical Exam  Constitutional: He appears well-developed and well-nourished. No distress.  HENT:  Head: Normocephalic.  Right Ear: External ear normal.  Left Ear: External ear normal.  Mouth/Throat: Oropharynx is clear and moist. No oropharyngeal exudate.  Multiple vesicular lesions with erythematous base on the left side of face involving left side of nose and spread towards the left temple; ulceration noted to the left upper soft and hard palate of mouth; uvula is midline, no tonsillar swelling or exudates.    Eyes: Conjunctivae, EOM and lids are normal. Pupils are equal, round, and  reactive to light. Lids are everted and swept, no foreign bodies found. Left eye exhibits no chemosis, no discharge, no exudate and no hordeolum. No foreign body present in the left eye. Left conjunctiva is not injected. Left conjunctiva has no hemorrhage. No scleral icterus. Left eye exhibits normal extraocular motion.  Slit lamp exam:      The left eye shows no corneal abrasion, no corneal flare, no corneal ulcer, no foreign body, no hyphema, no hypopyon and no fluorescein uptake.  Neck: No JVD present.  No neck swelling.   Pulmonary/Chest: Effort normal. No  respiratory distress.  Lymphadenopathy:    He has cervical adenopathy.  Neurological: He is alert. Coordination normal.  Skin: Skin is warm. No rash noted. No erythema. No pallor.  Psychiatric: He has a normal mood and affect. His behavior is normal.  Nursing note and vitals reviewed.   ED Course  Procedures  DIAGNOSTIC STUDIES: Oxygen Saturation is 98% on RA, normal by my interpretation.    COORDINATION OF CARE: 9:20 PM Discussed treatment plan which includes to order visual acuity screening with pt. Will consult with attending Dr. Christy Gentles. Pt acknowledges and agrees to plan. Visual acuity: Bilateral 20/25, R 20/25, L 20/50.  9:42 PM Will consult with ophthalmology and perform eye exam.  9:55 PM Eye exam performed, no dendritic lesion or corneal ulcer concerning for ophthalmic herpes zoster.  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  During examination, while talking with pt he admits to being under more stress than normal, rating his stress level 10/10, and stating he is going through a divorce and is trying to get custody of his children. Will prescribe antiviral medication and have pt follow up with PCP. Will give ophthalmology referral. Pt acknowledges and agrees to plan.   10:18 PM Appreciate consultation from ophthalmologist Dr. Posey Pronto who recommend no treatment at this time as there are no eye involvement.  Pt can f/u in office if he develop eye problem.  Recommend f/u with PCP.  10:36 PM Pt also request for a left knee sleeve due to having recurrent pain to L knee. On exam, mild tenderness noted to medial and latera joint line without crepitus or deformity.  FROM.  Sensation intact, no evidence of septic joint.  Knee sleeve provided.  MDM   Final diagnoses:  Shingles outbreak    BP 140/97 mmHg  Pulse 96  Temp(Src) 98.9 F (37.2 C) (Oral)  Resp 20  Ht 6' 1"  (1.854 m)  Wt 275 lb (124.739 kg)  BMI 36.29 kg/m2  SpO2 98%   I personally performed the services  described in this documentation, which was scribed in my presence. The recorded information has been reviewed and is accurate.     Domenic Moras, PA-C 01/06/15 2219  Domenic Moras, PA-C 01/06/15 2220  Domenic Moras, PA-C 01/06/15 1740  Ripley Fraise, MD 01/07/15 0040

## 2015-01-06 NOTE — ED Notes (Signed)
Patient presents stating his children were sick and now he is feeling bad.  C/O HEADACHE, RASH AND SORE THROAT  Has been taking Ibuprofen for the symptoms

## 2015-01-06 NOTE — ED Provider Notes (Signed)
Patient seen/examined in the Emergency Department in conjunction with [facility] Veney  Patient reports rash to left face Exam : pt appears to have rash to left face, c/w zoster.  It extends to tip of nose Plan: will need anti-virals and eye evaluation   Ripley Fraise, MD 01/06/15 2136

## 2015-04-19 ENCOUNTER — Encounter (HOSPITAL_COMMUNITY): Payer: Self-pay | Admitting: *Deleted

## 2015-04-19 ENCOUNTER — Emergency Department (HOSPITAL_COMMUNITY)
Admission: EM | Admit: 2015-04-19 | Discharge: 2015-04-19 | Disposition: A | Discharge status: Home / Self Care | Payer: Medicaid Other | Attending: Emergency Medicine | Admitting: Emergency Medicine

## 2015-04-19 DIAGNOSIS — Z79899 Other long term (current) drug therapy: Secondary | ICD-10-CM | POA: Insufficient documentation

## 2015-04-19 DIAGNOSIS — Z008 Encounter for other general examination: Secondary | ICD-10-CM | POA: Diagnosis present

## 2015-04-19 DIAGNOSIS — F1410 Cocaine abuse, uncomplicated: Secondary | ICD-10-CM | POA: Insufficient documentation

## 2015-04-19 DIAGNOSIS — F1910 Other psychoactive substance abuse, uncomplicated: Secondary | ICD-10-CM

## 2015-04-19 NOTE — ED Provider Notes (Signed)
CSN: [id number]     Arrival date & time 04/19/15  1551 History  By signing my name below, I, Irene Pap, attest that this documentation has been prepared under the direction and in the presence of [employer], PA-C. Electronically Signed: Irene Pap, ED Scribe. 04/19/2015. 5:05 PM.   Chief Complaint  Patient presents with  . Medical Clearance   The history is provided by the patient. No language interpreter was used.  HPI Comments: Bruce Koch is a 44 y.o. Male with a hx of depression who presents to the Emergency Department requesting a detox from cocaine. Pt reports his last use of cocaine was last night, and that he spent about $100. Pt denies any other medical complaints at this time. He denies use of other drugs or drinking alcohol. He denies chest pain, nausea, vomiting, abdominal pain, auditory or visual hallucinations, SI or HI.   Past Medical History  Diagnosis Date  . Depression 12/2013   History reviewed. No pertinent past surgical history. Family History  Problem Relation Age of Onset  . Diabetes Father   . Drug abuse Father     cocaine overdose   . Alcohol abuse Mother     history   . Alcohol abuse Sister   . Alcohol abuse Brother    Social History  Substance Use Topics  . Smoking status: Current Every Day Smoker -- 0.25 packs/day  . Smokeless tobacco: Never Used  . Alcohol Use: Yes     Comment: rarely     Review of Systems  Cardiovascular: Negative for chest pain.  Gastrointestinal: Negative for nausea, vomiting and abdominal pain.  Musculoskeletal: Positive for arthralgias.  Psychiatric/Behavioral: Negative for suicidal ideas and hallucinations.  All other systems reviewed and are negative.  Allergies  Review of patient's allergies indicates no known allergies.  Home Medications   Prior to Admission medications   Medication Sig Start Date End Date Taking? Authorizing Provider  acyclovir (ZOVIRAX) 400 MG tablet Take 1 tablet (400 mg total)  by mouth 4 (four) times daily. 01/06/15   Domenic Moras, PA-C  oxyCODONE-acetaminophen (PERCOCET/ROXICET) 5-325 MG per tablet Take 2 tablets by mouth every 4 (four) hours as needed for severe pain. 01/06/15   Domenic Moras, PA-C  pantoprazole (PROTONIX) 40 MG tablet Take 1 tablet (40 mg total) by mouth daily before supper. 08/09/14   Josalyn Funches, MD  prazosin (MINIPRESS) 1 MG capsule Take 1 capsule (1 mg total) by mouth 2 (two) times daily. 08/09/14   Josalyn Funches, MD  tadalafil (CIALIS) 20 MG tablet Take 0.5-1 tablets (10-20 mg total) by mouth every other day as needed for erectile dysfunction. 08/09/14   Josalyn Funches, MD  terbinafine (LAMISIL) 250 MG tablet Take 1 tablet (250 mg total) by mouth daily. For 12 weeks 08/09/14   Josalyn Funches, MD   BP 150/73 mmHg  Pulse 65  Temp(Src) 98 F (36.7 C) (Oral)  Resp 18  SpO2 99% Physical Exam  Constitutional: He is oriented to person, place, and time. He appears well-developed and well-nourished.  HENT:  Head: Normocephalic and atraumatic.  Mouth/Throat: Oropharynx is clear and moist.  Eyes: Conjunctivae and EOM are normal. Pupils are equal, round, and reactive to light. Right eye exhibits no discharge. Left eye exhibits no discharge. No scleral icterus.  Neck: Normal range of motion. Neck supple.  Cardiovascular: Normal rate, regular rhythm and normal heart sounds.   Pulmonary/Chest: Effort normal and breath sounds normal. No respiratory distress. He has no wheezes. He has no rales.  Abdominal: Soft. There is no tenderness.  Musculoskeletal: Normal range of motion. He exhibits no tenderness.  Neurological: He is alert and oriented to person, place, and time.  Cranial Nerves II-XII grossly intact  Skin: Skin is warm and dry. No rash noted.  Psychiatric: He has a normal mood and affect.  Nursing note and vitals reviewed.   ED Course  Procedures (including critical care time) DIAGNOSTIC STUDIES: Oxygen Saturation is 96% on RA, normal by my  interpretation.    COORDINATION OF CARE: 5:05 PM-Discussed treatment plan which includes outpatient resources with pt at bedside and pt agreed to plan.    Labs Review Labs Reviewed - No data to display  Imaging Review No results found. I have personally reviewed and evaluated these images and lab results as part of my medical decision-making.   EKG Interpretation None     Medications - No data to display Filed Vitals:   04/19/15 1643 04/19/15 1733  BP: 148/97 150/73  Pulse: 86 65  Temp: 98 F (36.7 C) 98 F (36.7 C)  Resp: 14 18    MDM  Bruce Koch is a 44 y.o. male who presents to the ED for cocaine detox. Patient denies any medical symptoms at this time. No suicidal or homicidal ideations, auditory or visual hallucinations, chest pain, shortness of breath. Denies any other illicit drug use or alcohol use. Given outpatient resources for treatment. Pt appears stable in NAD.  Final diagnoses:  Substance abuse    I personally performed the services described in this documentation, which was scribed in my presence. The recorded information has been reviewed and is accurate.    Comer Locket, PA-C 04/19/15 2101  Tanna Furry, MD 04/28/15 [id number]

## 2015-04-19 NOTE — ED Notes (Signed)
See PA-C note for secondary assessment

## 2015-04-19 NOTE — ED Notes (Addendum)
Pt states that he is wanting detox from cocaine. States that he last used last night and it was about $100 worth. Denies SI/HI

## 2015-04-19 NOTE — Discharge Instructions (Signed)
Polysubstance Abuse When people abuse more than one drug or type of drug it is called polysubstance or polydrug abuse. For example, many smokers also drink alcohol. This is one form of polydrug abuse. Polydrug abuse also refers to the use of a drug to counteract an unpleasant effect produced by another drug. It may also be used to help with withdrawal from another drug. People who take stimulants may become agitated. Sometimes this agitation is countered with a tranquilizer. This helps protect against the unpleasant side effects. Polydrug abuse also refers to the use of different drugs at the same time.  Anytime drug use is interfering with normal living activities, it has become abuse. This includes problems with family and friends. Psychological dependence has developed when your mind tells you that the drug is needed. This is usually followed by physical dependence which has developed when continuing increases of drug are required to get the same feeling or "high". This is known as addiction or chemical dependency. A person's risk is much higher if there is a history of chemical dependency in the family. SIGNS OF CHEMICAL DEPENDENCY  You have been told by friends or family that drugs have become a problem.  You fight when using drugs.  You are having blackouts (not remembering what you do while using).  You feel sick from using drugs but continue using.  You lie about use or amounts of drugs (chemicals) used.  You need chemicals to get you going.  You are suffering in work performance or in school because of drug use.  You get sick from use of drugs but continue to use anyway.  You need drugs to relate to people or feel comfortable in social situations.  You use drugs to forget problems. "Yes" answered to any of the above signs of chemical dependency indicates there are problems. The longer the use of drugs continues, the greater the problems will become. If there is a family history of  drug or alcohol use, it is best not to experiment with these drugs. Continual use leads to tolerance. After tolerance develops more of the drug is needed to get the same feeling. This is followed by addiction. With addiction, drugs become the most important part of life. It becomes more important to take drugs than participate in the other usual activities of life. This includes relating to friends and family. Addiction is followed by dependency. Dependency is a condition where drugs are now needed not just to get high, but to feel normal. Addiction cannot be cured but it can be stopped. This often requires outside help and the care of professionals. Treatment centers are listed in the yellow pages under: Cocaine, Narcotics, and Alcoholics Anonymous. Most hospitals and clinics can refer you to a specialized care center. Talk to your caregiver if you need help.   This information is not intended to replace advice given to you by your health care provider. Make sure you discuss any questions you have with your health care provider.   Document Released: 11/28/2004 Document Revised: 07/01/2011 Document Reviewed: 04/13/2014 Elsevier Interactive Patient Education 2016 [employer].    Emergency Department Resource Guide 1) Find a Doctor and Pay Out of Pocket Although you won't have to find out who is covered by your insurance plan, it is a good idea to ask around and get recommendations. You will then need to call the office and see if the doctor you have chosen will accept you as a new patient and what types of options  they offer for patients who are self-pay. Some doctors offer discounts or will set up payment plans for their patients who do not have insurance, but you will need to ask so you aren't surprised when you get to your appointment.  2) Contact Your Local Health Department Not all health departments have doctors that can see patients for sick visits, but many do, so it is worth a call to see if  yours does. If you don't know where your local health department is, you can check in your phone book. The CDC also has a tool to help you locate your state's health department, and many state websites also have listings of all of their local health departments.  3) Find a [place] [facility] If your illness is not likely to be very severe or complicated, you may want to try a walk in clinic. These are popping up all over the country in pharmacies, drugstores, and shopping centers. They're usually staffed by nurse practitioners or physician assistants that have been trained to treat common illnesses and complaints. They're usually fairly quick and inexpensive. However, if you have serious medical issues or chronic medical problems, these are probably not your best option.  No Primary Care Doctor: - Call Health Connect at  [phone] - they can help you locate a primary care doctor that  accepts your insurance, provides certain services, etc. - Physician Referral Service- [phone]  Chronic Pain Problems: Organization         Address  Phone   Notes  [facility]  [phone] Patients need to be referred by their primary care doctor.   Medication Assistance: Organization         Address  Phone   Notes  [facility] Medication [facility] [address] [place], [place] [address] [phone] --Must be a resident of [facility] -- Must have NO insurance coverage whatsoever (no Medicaid/ Medicare, etc.) -- The pt. MUST have a primary care doctor that directs their care regularly and follows them in the community   [facility]ist  [phone]   [employer]  [phone]    Agencies that provide inexpensive medical care: Organization         Address  Phone   Notes  [place]  [phone]   [facility]    [phone]   [facility] [place],  [phone]    [place] [address], [place] [phone]   Planned Parenthood    [phone]   [facility]    [phone]   [place] and [place] [address], [place] Phone:  [phone], Fax:  [phone] Hours of Operation:  9 am - 6 pm, M-F.  Also accepts Medicaid/Medicare and self-pay.  [facility] for [place] [address], [address], [place] Phone: [phone], Fax: [phone]. Hours of Operation:  8:30 am - 5:30 pm, M-F.  Also accepts Medicaid and self-pay.  [facility] [place] [address], [place] Phone: [phone]   [place], [place], [place] [phone], Ext. [phone] Mondays & Thursdays: 7-9 AM.  First 15 patients are seen on a first come, first serve basis.    [place] Providers:  Organization         Address  Phone   Notes  [employer] Lynch  [facility] [address], [place] [phone] Also accepts self-pay patients.  [facility] [address], [place]  [phone]   [place], [address], [place] [phone]   [facility] [address] [place] [phone]   Lucianne Lei [address], [place]   [phone] Only accepts [place] Access [place] patients after they have their name applied to their card.   Self-Pay (no insurance) in [place] [facility]:  Organization         Address  Phone   Notes  Sickle Cell Patients, [facility] [place] [phone]   [facility] Urgent Care [place] [phone]   [facility] [place]  [place], [place], [place] [phone]   [facility]/Dr. Osei-Bonsu  [address] [place] or [address], [place] [phone] Phone number for both [place] and [place] locations is the same.  Urgent Medical and [facility] [address], [place] [phone]   [facility] [address], [place] or [address] [phone]   [facility] [address], [place] [phone], phone; [phone], fax Sees patients 1st and 3rd Saturday of every month.  Must not qualify for public or private insurance (i.e. Medicaid, Medicare, [place] Health Choice, Veterans' Benefits)  Household income should be no more than 200% of the poverty level The clinic cannot treat you if you are pregnant or think you are pregnant  Sexually transmitted diseases are not treated at the clinic.    Dental Care: Organization         Address  Phone  Notes  [facility] Department of [facility] [place] [phone] Accepts children up to age 16 who are enrolled in [place] or [place]; pregnant women with a Medicaid card; and children who have applied for Medicaid or [place] Health Choice, but were declined, whose parents can pay a reduced fee at time of service.  [facility] of [facility]  [address], [place] [phone] Accepts children up to age 13 who are enrolled in [place] or [place]; pregnant women with a Medicaid card; and children who have applied for Medicaid or  Health Choice, but were declined, whose parents can pay a reduced fee at time of service.  [place] Adult Dental Access PROGRAM  [place] [phone] Patients are seen by appointment only. Walk-ins are not accepted. [place] will see patients 15 years of age and older. Monday - Tuesday (8am-5pm) Most Wednesdays (8:30-5pm) $30 per visit, cash only  [facility] Adult [facility] Access PROGRAM  [address], [facility] [phone] Patients are seen by  appointment only. Walk-ins are not accepted. [place] will see patients 34 years of age and older. One Wednesday Evening (Monthly: Volunteer Based).  $30 per visit, cash only  [place]  [phone] for adults; Children under age 70, call Graduate Pediatric Dentistry at [phone]. Children aged 61-14, please call [phone] to request a pediatric application.  Dental services are provided in all areas of dental care including fillings, crowns and bridges, complete and partial dentures,  implants, gum treatment, root canals, and extractions. Preventive care is also provided. Treatment is provided to both adults and children. Patients are selected via a lottery and there is often a waiting list.   [facility] [address], [place]  [phone] [email or web address]   [facility] [address] [place], [place] [phone], Ext. [phone] Second and Fourth Thursday of each month, opens at 6:30 AM; Clinic ends at 9 AM.  Patients are seen on a first-come first-served basis, and a limited number are seen during each clinic.   [facility]  [address] [place], [place] [phone]   Eligibility Requirements You must have lived in [place], [place], or [place] counties for at least the last three months.   You cannot be eligible for state or federal sponsored [employer], including [employer], [place], or [employer].   You generally cannot be eligible for healthcare insurance through your employer.    How to apply: Eligibility screenings are held every Tuesday and Wednesday afternoon from 1:00 pm until 4:00 pm. You do not need an appointment for the interview!  [facility] [address] [place], [place]   [place]  [place] Department  [place]  [phone]     Behavioral Health Resources in the Community: Intensive Outpatient Programs Organization         Address  Phone  Notes  [place]. [address], [place], [place] [phone]   [facility] Outpatient [address] [place], [place]   ADS: Alcohol & Drug Svcs [address], [place], [place]   [place] [address]  [place], [place] or [phone]   Substance Abuse Resources Organization         Address  Phone  Notes  Alcohol and Drug Services  [phone]   [place]  [phone]   [facility] [place]   Chinita Pester  [phone]   Residential & Outpatient Substance Abuse Program  [phone]   Psychological Services Organization         Address  Phone  Notes  [facility]  [place]  [phone]   [place] [address], [place] or [phone]    Mobile Crisis Teams Organization         Address  Phone  Notes  [facility], Mobile Crisis Care Unit  [phone]   [facility]  [address] [place], [place]   [place] [address], [place] [phone]    Self-Help/Support Groups Organization         Address  Phone             Notes  [place]. of [place] - variety of support groups  [place] Call for more information  Narcotics Anonymous (NA), [facility] [address], [employer] [place]  2 meetings at this location   Special educational needs teacher         Address  Phone  Notes  [facility]idential [facility] [place],    [place]  [phone]   [facility]  [address] [place] [phone], [place], [place]   [place], [place] [phone] Admissions: 8am-3pm M-F  Incentives  Substance [place] [address]    [place], [place] [phone]   [facility] [address] [place], [place], [place]   [facility] [address],  [place], [place]   [facility] - Intensive Outpatient [address] Kristeen [address], [place], [place]   [facility] ([place].,  [place], [place] [phone] or [phone]   [facility] Services (RTS) [address], [place], [place] Accepts Medicaid  [facility] [place] [address],  [place] [phone] Substance Abuse/Addiction Treatment   [facility] Organization         Address  Phone  Notes  [facility]  [phone]   Domenic Schwab, PhD [address] [place], [place]   [phone] or [phone]   [facility] [place], [place] [phone]   [facility] [address] [place], [place] [phone] Insurance/Medicaid/sponsorship through [facility] and Families [address], [address] [place]                                    [place], [place] [phone] [place] [address], [place] [phone]    Dr. Adele Schilder  [phone]   [facility] Dept. 1) [address] [place] 2) [place] 3)  [place] [address], [place] [phone]  [phone]   [place] [phone] or [phone] (After Hours)     \

## 2015-04-19 NOTE — ED Notes (Signed)
Called pt to be triaged 2X. No response.

## 2015-04-24 ENCOUNTER — Emergency Department (HOSPITAL_COMMUNITY)
Admission: EM | Admit: 2015-04-24 | Discharge: 2015-04-25 | Disposition: A | Discharge status: Home / Self Care | Payer: Medicaid Other | Attending: Emergency Medicine | Admitting: Emergency Medicine

## 2015-04-24 ENCOUNTER — Emergency Department (HOSPITAL_COMMUNITY): Payer: Medicaid Other

## 2015-04-24 ENCOUNTER — Encounter (HOSPITAL_COMMUNITY): Payer: Self-pay | Admitting: Emergency Medicine

## 2015-04-24 DIAGNOSIS — F17200 Nicotine dependence, unspecified, uncomplicated: Secondary | ICD-10-CM | POA: Diagnosis not present

## 2015-04-24 DIAGNOSIS — Z711 Person with feared health complaint in whom no diagnosis is made: Secondary | ICD-10-CM | POA: Diagnosis not present

## 2015-04-24 DIAGNOSIS — Z8659 Personal history of other mental and behavioral disorders: Secondary | ICD-10-CM | POA: Diagnosis not present

## 2015-04-24 DIAGNOSIS — Z0000 Encounter for general adult medical examination without abnormal findings: Secondary | ICD-10-CM

## 2015-04-24 DIAGNOSIS — F1410 Cocaine abuse, uncomplicated: Secondary | ICD-10-CM | POA: Diagnosis present

## 2015-04-24 LAB — CBC WITH DIFFERENTIAL/PLATELET
Basophils Absolute: 0 10*3/uL (ref 0.0–0.1)
Basophils Relative: 0 %
EOS PCT: 1 %
Eosinophils Absolute: 0.1 10*3/uL (ref 0.0–0.7)
HEMATOCRIT: 45.8 % (ref 39.0–52.0)
HEMOGLOBIN: 15.5 g/dL (ref 13.0–17.0)
LYMPHS ABS: 2.5 10*3/uL (ref 0.7–4.0)
LYMPHS PCT: 32 %
MCH: 29 pg (ref 26.0–34.0)
MCHC: 33.8 g/dL (ref 30.0–36.0)
MCV: 85.8 fL (ref 78.0–100.0)
Monocytes Absolute: 0.6 10*3/uL (ref 0.1–1.0)
Monocytes Relative: 8 %
Neutro Abs: 4.5 10*3/uL (ref 1.7–7.7)
Neutrophils Relative %: 59 %
PLATELETS: 227 10*3/uL (ref 150–400)
RBC: 5.34 MIL/uL (ref 4.22–5.81)
RDW: 13.3 % (ref 11.5–15.5)
WBC: 7.7 10*3/uL (ref 4.0–10.5)

## 2015-04-24 LAB — COMPREHENSIVE METABOLIC PANEL
ALT: 34 U/L (ref 17–63)
AST: 44 U/L — AB (ref 15–41)
Albumin: 3.7 g/dL (ref 3.5–5.0)
Alkaline Phosphatase: 62 U/L (ref 38–126)
Anion gap: 10 (ref 5–15)
BUN: 19 mg/dL (ref 6–20)
CHLORIDE: 105 mmol/L (ref 101–111)
CO2: 26 mmol/L (ref 22–32)
CREATININE: 1.28 mg/dL — AB (ref 0.61–1.24)
Calcium: 9.3 mg/dL (ref 8.9–10.3)
GFR calc non Af Amer: >60 mL/min (ref >60)
Glucose, Bld: 108 mg/dL — ABNORMAL HIGH (ref 65–99)
Potassium: 3.9 mmol/L (ref 3.5–5.1)
SODIUM: 141 mmol/L (ref 135–145)
Total Bilirubin: 0.5 mg/dL (ref 0.3–1.2)
Total Protein: 5.9 g/dL — ABNORMAL LOW (ref 6.5–8.1)

## 2015-04-24 LAB — RAPID URINE DRUG SCREEN, HOSP PERFORMED
AMPHETAMINES: NOT DETECTED
BARBITURATES: NOT DETECTED
Benzodiazepines: NOT DETECTED
Cocaine: POSITIVE — AB
Opiates: NOT DETECTED
TETRAHYDROCANNABINOL: NOT DETECTED

## 2015-04-24 LAB — SALICYLATE LEVEL

## 2015-04-24 LAB — ACETAMINOPHEN LEVEL

## 2015-04-24 LAB — ETHANOL: Alcohol, Ethyl (B): <5 mg/dL (ref <5)

## 2015-04-24 NOTE — ED Notes (Signed)
Pt. requesting detox for his Cocaine addiction , seen here last week for the same addiction problem , last Cocaine intake last night , denies suicidal ideation /no hallucinations .

## 2015-04-25 NOTE — ED Provider Notes (Signed)
CSN: [id number]     Arrival date & time 04/24/15  2018 History   First MD Initiated Contact with Patient 04/24/15 2243     Chief Complaint  Patient presents with  . Addiction Problem     (Consider location/radiation/quality/duration/timing/severity/associated sxs/prior Treatment) HPI  45 year old male who uses cocaine mostly food presents to the emergency department for medical clearance so he can enter drug rehabilitation to prior. He does not complain of any chest pain, shortness of breath, nausea, vomiting, abdominal pain, back pain, rashes or other findings. Does not use any other drugs. No history of the same. Progressively worsening. No exacerbating or relieving factors.  Past Medical History  Diagnosis Date  . Depression 12/2013   History reviewed. No pertinent past surgical history. Family History  Problem Relation Age of Onset  . Diabetes Father   . Drug abuse Father     cocaine overdose   . Alcohol abuse Mother     history   . Alcohol abuse Sister   . Alcohol abuse Brother    Social History  Substance Use Topics  . Smoking status: Current Every Day Smoker -- 0.25 packs/day  . Smokeless tobacco: Never Used  . Alcohol Use: Yes     Comment: rarely     Review of Systems  Constitutional: Negative for fever, chills, activity change and fatigue.  HENT: Negative for congestion and rhinorrhea.   Eyes: Negative for visual disturbance.  Respiratory: Negative for cough and shortness of breath.   Cardiovascular: Negative for chest pain.  Gastrointestinal: Negative for vomiting, abdominal pain, diarrhea and constipation.  Endocrine: Negative for polyuria.  Genitourinary: Negative for dysuria and flank pain.  Musculoskeletal: Negative for back pain and neck pain.  Skin: Negative for wound.  Neurological: Negative for headaches.  All other systems reviewed and are negative.     Allergies  Review of patient's allergies indicates no known allergies.  Home Medications    Prior to Admission medications   Medication Sig Start Date End Date Taking? Authorizing Provider  acyclovir (ZOVIRAX) 400 MG tablet Take 1 tablet (400 mg total) by mouth 4 (four) times daily. Patient not taking: Reported on 04/24/2015 01/06/15   Domenic Moras, PA-C  oxyCODONE-acetaminophen (PERCOCET/ROXICET) 5-325 MG per tablet Take 2 tablets by mouth every 4 (four) hours as needed for severe pain. Patient not taking: Reported on 04/24/2015 01/06/15   Domenic Moras, PA-C  pantoprazole (PROTONIX) 40 MG tablet Take 1 tablet (40 mg total) by mouth daily before supper. Patient not taking: Reported on 04/24/2015 08/09/14   Boykin Nearing, MD  prazosin (MINIPRESS) 1 MG capsule Take 1 capsule (1 mg total) by mouth 2 (two) times daily. Patient not taking: Reported on 04/24/2015 08/09/14   Boykin Nearing, MD  tadalafil (CIALIS) 20 MG tablet Take 0.5-1 tablets (10-20 mg total) by mouth every other day as needed for erectile dysfunction. Patient not taking: Reported on 04/24/2015 08/09/14   Boykin Nearing, MD  terbinafine (LAMISIL) 250 MG tablet Take 1 tablet (250 mg total) by mouth daily. For 12 weeks Patient not taking: Reported on 04/24/2015 08/09/14   Josalyn Funches, MD   BP 161/96 mmHg  Pulse 77  Temp(Src) 98.5 F (36.9 C) (Oral)  Resp 18  SpO2 99% Physical Exam  Constitutional: He is oriented to person, place, and time. He appears well-developed and well-nourished.  HENT:  Head: Normocephalic and atraumatic.  Neck: Normal range of motion.  Cardiovascular: Normal rate.   Pulmonary/Chest: Effort normal and breath sounds normal. No respiratory distress.  Abdominal: Soft. Bowel sounds are normal. He exhibits no distension.  Musculoskeletal: Normal range of motion. He exhibits no edema or tenderness.  Neurological: He is alert and oriented to person, place, and time. No cranial nerve deficit.  Skin: Skin is warm and dry.  Nursing note and vitals reviewed.   ED Course  Procedures (including critical care  time) Labs Review Labs Reviewed  COMPREHENSIVE METABOLIC PANEL - Abnormal; Notable for the following:    Glucose, Bld 108 (*)    Creatinine, Ser 1.28 (*)    Total Protein 5.9 (*)    AST 44 (*)    All other components within normal limits  URINE RAPID DRUG SCREEN, HOSP PERFORMED - Abnormal; Notable for the following:    Cocaine POSITIVE (*)    All other components within normal limits  ACETAMINOPHEN LEVEL - Abnormal; Notable for the following:    Acetaminophen (Tylenol), Serum <10 (*)    All other components within normal limits  ETHANOL  CBC WITH DIFFERENTIAL/PLATELET  SALICYLATE LEVEL    Imaging Review Dg Chest 2 View  04/24/2015  CLINICAL DATA:  Admission chest radiograph for cocaine detox. Initial encounter. EXAM: CHEST  2 VIEW COMPARISON:  None. FINDINGS: The lungs are well-aerated and clear. There is no evidence of focal opacification, pleural effusion or pneumothorax. The heart is normal in size; the mediastinal contour is within normal limits. No acute osseous abnormalities are seen. IMPRESSION: No acute cardiopulmonary process seen. Electronically Signed   By: Garald Balding M.D.   On: 04/24/2015 23:43   I have personally reviewed and evaluated these images and lab results as part of my medical decision-making.   EKG Interpretation   Date/Time:  Monday April 24 2015 23:17:35 EST Ventricular Rate:  69 PR Interval:  197 QRS Duration: 88 QT Interval:  390 QTC Calculation: 418 R Axis:   79 Text Interpretation:  Sinus rhythm Confirmed by Aydn Ferrara MD, Corene Cornea [phone])  on 04/24/2015 11:48:45 PM      MDM   Final diagnoses:  Visit for well man health check    45 year old male here for medical clearance to enter drug rehabilitation tomorrow. Exam is benign. History is not specialist for any medical advise. He is medically cleared. Will print his lab and x-ray results so he can take them with him to his appointment tomorrow.    Merrily Pew, MD 04/25/15 [phone]

## 2015-05-20 ENCOUNTER — Encounter (HOSPITAL_COMMUNITY): Payer: Self-pay | Admitting: *Deleted

## 2015-05-20 ENCOUNTER — Emergency Department (HOSPITAL_COMMUNITY)
Admission: EM | Admit: 2015-05-20 | Discharge: 2015-05-20 | Disposition: A | Discharge status: Home / Self Care | Payer: Medicaid Other | Attending: Emergency Medicine | Admitting: Emergency Medicine

## 2015-05-20 ENCOUNTER — Emergency Department (HOSPITAL_COMMUNITY): Payer: Medicaid Other

## 2015-05-20 DIAGNOSIS — S90221A Contusion of right lesser toe(s) with damage to nail, initial encounter: Secondary | ICD-10-CM

## 2015-05-20 DIAGNOSIS — Y9289 Other specified places as the place of occurrence of the external cause: Secondary | ICD-10-CM | POA: Diagnosis not present

## 2015-05-20 DIAGNOSIS — S99921A Unspecified injury of right foot, initial encounter: Secondary | ICD-10-CM | POA: Diagnosis present

## 2015-05-20 DIAGNOSIS — S92421A Displaced fracture of distal phalanx of right great toe, initial encounter for closed fracture: Secondary | ICD-10-CM | POA: Diagnosis not present

## 2015-05-20 DIAGNOSIS — Z8659 Personal history of other mental and behavioral disorders: Secondary | ICD-10-CM | POA: Diagnosis not present

## 2015-05-20 DIAGNOSIS — S92911A Unspecified fracture of right toe(s), initial encounter for closed fracture: Secondary | ICD-10-CM

## 2015-05-20 DIAGNOSIS — Y9389 Activity, other specified: Secondary | ICD-10-CM | POA: Insufficient documentation

## 2015-05-20 DIAGNOSIS — S90411A Abrasion, right great toe, initial encounter: Secondary | ICD-10-CM | POA: Diagnosis not present

## 2015-05-20 DIAGNOSIS — W2209XA Striking against other stationary object, initial encounter: Secondary | ICD-10-CM | POA: Diagnosis not present

## 2015-05-20 DIAGNOSIS — F17200 Nicotine dependence, unspecified, uncomplicated: Secondary | ICD-10-CM | POA: Diagnosis not present

## 2015-05-20 DIAGNOSIS — S90211A Contusion of right great toe with damage to nail, initial encounter: Secondary | ICD-10-CM | POA: Insufficient documentation

## 2015-05-20 DIAGNOSIS — Y998 Other external cause status: Secondary | ICD-10-CM | POA: Insufficient documentation

## 2015-05-20 MED ORDER — OXYCODONE-ACETAMINOPHEN 5-325 MG PO TABS: 2.0000 | ORAL_TABLET | ORAL | Status: DC | PRN

## 2015-05-20 MED ORDER — OXYCODONE-ACETAMINOPHEN 5-325 MG PO TABS
1.0000 | ORAL_TABLET | Freq: Once | ORAL | Status: AC
  Administered 2015-05-20: 1 via ORAL
  Filled 2015-05-20: qty 1

## 2015-05-20 MED ORDER — LIDOCAINE HCL 2 % IJ SOLN
10.0000 mL | Freq: Once | INTRAMUSCULAR | Status: AC
  Administered 2015-05-20: 10 mL via INTRADERMAL
  Filled 2015-05-20: qty 20

## 2015-05-20 NOTE — ED Notes (Signed)
See triage note.  Pedal pulse noted.

## 2015-05-20 NOTE — Discharge Instructions (Signed)
Do NOT bear weight on the toe.  Take motrin for pain.   Take percocet for severe pain.  The toe nail may grow funny or may fall off   Wear splint at all times.   See ortho in a week for repeat xrays.  Return to ER if you have worse toe swelling or pain, toe turning blue.

## 2015-05-20 NOTE — ED Notes (Signed)
Patient transported to X-ray 

## 2015-05-20 NOTE — ED Notes (Signed)
Pt verbalizes understanding regarding not driving

## 2015-05-20 NOTE — ED Provider Notes (Signed)
CSN: [id number]     Arrival date & time 05/20/15  1420 History   First MD Initiated Contact with Patient 05/20/15 1513     Chief Complaint  Patient presents with  . Foot Injury     (Consider location/radiation/quality/duration/timing/severity/associated sxs/prior Treatment) The history is provided by the patient.  Bruce Koch is a 45 y.o. male hx of depression here with R foot injury. He states that he was lifting a heavy piece of granite and accidentally hit his right big toe. He has severe pain and swelling afterwards. Denies any other injuries. Didn't take any pain meds prior to arrival.    Past Medical History  Diagnosis Date  . Depression 12/2013   History reviewed. No pertinent past surgical history. Family History  Problem Relation Age of Onset  . Diabetes Father   . Drug abuse Father     cocaine overdose   . Alcohol abuse Mother     history   . Alcohol abuse Sister   . Alcohol abuse Brother    Social History  Substance Use Topics  . Smoking status: Current Every Day Smoker -- 0.25 packs/day  . Smokeless tobacco: Never Used  . Alcohol Use: Yes     Comment: rarely     Review of Systems  Musculoskeletal:       R big toe pain   All other systems reviewed and are negative.     Allergies  Review of patient's allergies indicates no known allergies.  Home Medications   Prior to Admission medications   Medication Sig Start Date End Date Taking? Authorizing Provider  acyclovir (ZOVIRAX) 400 MG tablet Take 1 tablet (400 mg total) by mouth 4 (four) times daily. Patient not taking: Reported on 04/24/2015 01/06/15   Domenic Moras, PA-C  oxyCODONE-acetaminophen (PERCOCET/ROXICET) 5-325 MG per tablet Take 2 tablets by mouth every 4 (four) hours as needed for severe pain. Patient not taking: Reported on 04/24/2015 01/06/15   Domenic Moras, PA-C  pantoprazole (PROTONIX) 40 MG tablet Take 1 tablet (40 mg total) by mouth daily before supper. Patient not taking: Reported on 04/24/2015  08/09/14   Boykin Nearing, MD  prazosin (MINIPRESS) 1 MG capsule Take 1 capsule (1 mg total) by mouth 2 (two) times daily. Patient not taking: Reported on 04/24/2015 08/09/14   Boykin Nearing, MD  tadalafil (CIALIS) 20 MG tablet Take 0.5-1 tablets (10-20 mg total) by mouth every other day as needed for erectile dysfunction. Patient not taking: Reported on 04/24/2015 08/09/14   Boykin Nearing, MD  terbinafine (LAMISIL) 250 MG tablet Take 1 tablet (250 mg total) by mouth daily. For 12 weeks Patient not taking: Reported on 04/24/2015 08/09/14   Josalyn Funches, MD   BP 171/155 mmHg  Pulse 73  Temp(Src) 97.7 F (36.5 C) (Oral)  Resp 18  SpO2 99% Physical Exam  Constitutional:  Uncomfortable   HENT:  Head: Normocephalic.  Eyes: Pupils are equal, round, and reactive to light.  Neck: Normal range of motion.  Cardiovascular: Normal rate.   Pulmonary/Chest: Effort normal.  Abdominal: Soft.  Musculoskeletal:  R big toe swollen, small superficial laceration proximal to the nail bed but not involving the nail bed. There is a subungual hematoma under the nail.   Skin: Skin is warm and dry.  Psychiatric: He has a normal mood and affect. His behavior is normal. Judgment and thought content normal.  Nursing note and vitals reviewed.   ED Course  Procedures (including critical care time)  Subungual hematoma drainage R big toe  digital block applied with 2 % lido 5cc. Using a 18 gauge needle I drilled a hole R big toe nail and minimal blood came out.     Labs Review Labs Reviewed - No data to display  Imaging Review Dg Foot Complete Right  05/20/2015  CLINICAL DATA:  Dropped concrete slept on foot today, now with right great toe pain. EXAM: RIGHT FOOT COMPLETE - 3+ VIEW COMPARISON:  None. FINDINGS: There is a comminuted fracture involving the distal phalanx of the great toe. No definitive intra-articular extension. Expected adjacent soft tissue swelling and potential associated soft tissue  laceration. No radiopaque foreign body. No additional fractures identified. IMPRESSION: Comminuted fracture involving the distal phalanx of the great toe without definitive intra-articular extension. No radiopaque foreign body. Electronically Signed   By: Sandi Mariscal M.D.   On: 05/20/2015 15:08   I have personally reviewed and evaluated these images and lab results as part of my medical decision-making.   EKG Interpretation None      MDM   Final diagnoses:  None   Bruce Koch is a 45 y.o. male here with R big toe injury. Has laceration above the nail bed. Does have subungual hematoma which needs to be drained. Consider contusion vs fracture. Will give pain meds and get xrays.   4:11 PM I performed digital block. The laceration appears to extend close to the nail bed but not through it. I wanted to suture laceration but he refused. I was able to drain the subungual hematoma and placed surgicel for bleeding control. No obvious open fracture. I had ortho tech place a splint and gave him crutches. He is not to bear weight for at least a week until he sees ortho for repeat xray.    Wandra Arthurs, MD 05/20/15 [phone]
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
## 2015-05-21 ENCOUNTER — Encounter (HOSPITAL_COMMUNITY): Payer: Self-pay | Admitting: Emergency Medicine

## 2015-05-21 ENCOUNTER — Emergency Department (HOSPITAL_COMMUNITY)
Admission: EM | Admit: 2015-05-21 | Discharge: 2015-05-21 | Disposition: A | Discharge status: Home / Self Care | Payer: Medicaid Other | Attending: Emergency Medicine | Admitting: Emergency Medicine

## 2015-05-21 ENCOUNTER — Emergency Department (HOSPITAL_COMMUNITY): Payer: Medicaid Other

## 2015-05-21 DIAGNOSIS — S90411A Abrasion, right great toe, initial encounter: Secondary | ICD-10-CM | POA: Insufficient documentation

## 2015-05-21 DIAGNOSIS — Y998 Other external cause status: Secondary | ICD-10-CM | POA: Diagnosis not present

## 2015-05-21 DIAGNOSIS — W228XXA Striking against or struck by other objects, initial encounter: Secondary | ICD-10-CM | POA: Diagnosis not present

## 2015-05-21 DIAGNOSIS — S97101D Crushing injury of unspecified right toe(s), subsequent encounter: Secondary | ICD-10-CM

## 2015-05-21 DIAGNOSIS — Y9389 Activity, other specified: Secondary | ICD-10-CM | POA: Insufficient documentation

## 2015-05-21 DIAGNOSIS — Y9289 Other specified places as the place of occurrence of the external cause: Secondary | ICD-10-CM | POA: Diagnosis not present

## 2015-05-21 DIAGNOSIS — F17200 Nicotine dependence, unspecified, uncomplicated: Secondary | ICD-10-CM | POA: Insufficient documentation

## 2015-05-21 DIAGNOSIS — S99921A Unspecified injury of right foot, initial encounter: Secondary | ICD-10-CM | POA: Diagnosis present

## 2015-05-21 HISTORY — DX: Unspecified fracture of unspecified toe(s), initial encounter for closed fracture: S92.919A

## 2015-05-21 MED ORDER — CEPHALEXIN 500 MG PO CAPS: 500.0000 mg | ORAL_CAPSULE | Freq: Four times a day (QID) | ORAL | Status: DC

## 2015-05-21 MED ORDER — IBUPROFEN 400 MG PO TABS: 400.0000 mg | ORAL_TABLET | Freq: Four times a day (QID) | ORAL | Status: DC | PRN

## 2015-05-21 MED ORDER — OXYCODONE-ACETAMINOPHEN 5-325 MG PO TABS
1.0000 | ORAL_TABLET | Freq: Once | ORAL | Status: AC
  Administered 2015-05-21: 1 via ORAL
  Filled 2015-05-21: qty 1

## 2015-05-21 MED ORDER — IBUPROFEN 200 MG PO TABS
400.0000 mg | ORAL_TABLET | Freq: Once | ORAL | Status: AC
Start: 2015-05-21 — End: 2015-05-21
  Administered 2015-05-21: 400 mg via ORAL
  Filled 2015-05-21: qty 2

## 2015-05-21 MED ORDER — TRAMADOL HCL 50 MG PO TABS: 50.0000 mg | ORAL_TABLET | Freq: Four times a day (QID) | ORAL | Status: DC | PRN

## 2015-05-21 NOTE — Discharge Instructions (Signed)
You were seen today and had a fracture of your toe. Repeat x-ray showed no significant change. Maintain you or splint until orthopedic follow-up as previously directed. You will be given a very short course of additional pain medication in addition to antibiotics.  Toe Fracture A toe fracture is a break in one of the toe bones (phalanges). CAUSES This condition may be caused by:  Dropping a heavy object on your toe.  Stubbing your toe.  Overusing your toe or doing repetitive exercise.  Twisting or stretching your toe out of place. RISK FACTORS This condition is more likely to develop in people who:  Play contact sports.  Have a bone disease.  Have a low calcium level. SYMPTOMS The main symptoms of this condition are swelling and pain in the toe. The pain may get worse with standing or walking. Other symptoms include:  Bruising.  Stiffness.  Numbness.  A change in the way the toe looks.  Broken bones that poke through the skin.  Blood beneath the toenail. DIAGNOSIS This condition is diagnosed with a physical exam. You may also have X-rays. TREATMENT  Treatment for this condition depends on the type of fracture and its severity. Treatment may involve:  Taping the broken toe to a toe that is next to it (buddy taping). This is the most common treatment for fractures in which the bone has not moved out of place (nondisplaced fracture).  Wearing a shoe that has a wide, rigid sole to protect the toe and to limit its movement.  Wearing a walking cast.  Having a procedure to move the toe back into place.  Surgery. This may be needed:  If there are many pieces of broken bone that are out of place (displaced).  If the toe joint breaks.  If the bone breaks through the skin.  Physical therapy. This is done to help regain movement and strength in the toe. You may need follow-up X-rays to make sure that the bone is healing well and staying in position. HOME CARE  INSTRUCTIONS If You Have a Cast:  Do not stick anything inside the cast to scratch your skin. Doing that increases your risk of infection.  Check the skin around the cast every day. Report any concerns to your health care provider. You may put lotion on dry skin around the edges of the cast. Do not apply lotion to the skin underneath the cast.  Do not put pressure on any part of the cast until it is fully hardened. This may take several hours.  Keep the cast clean and dry. Bathing  Do not take baths, swim, or use a hot tub until your health care provider approves. Ask your health care provider if you can take showers. You may only be allowed to take sponge baths for bathing.  If your health care provider approves bathing and showering, cover the cast or bandage (dressing) with a watertight plastic bag to protect it from water. Do not let the cast or dressing get wet. Managing Pain, Stiffness, and Swelling  If you do not have a cast, apply ice to the injured area, if directed.  Put ice in a plastic bag.  Place a towel between your skin and the bag.  Leave the ice on for 20 minutes, 2-3 times per day.  Move your toes often to avoid stiffness and to lessen swelling.  Raise (elevate) the injured area above the level of your heart while you are sitting or lying down. Driving  Do  not drive or operate heavy machinery while taking pain medicine.  Do not drive while wearing a cast on a foot that you use for driving. Activity  Return to your normal activities as directed by your health care provider. Ask your health care provider what activities are safe for you.  Perform exercises daily as directed by your health care provider or physical therapist. Safety  Do not use the injured limb to support your body weight until your health care provider says that you can. Use crutches or other assistive devices as directed by your health care provider. General Instructions  If your toe was  treated with buddy taping, follow your health care provider's instructions for changing the gauze and tape. Change it more often:  The gauze and tape get wet. If this happens, dry the space between the toes.  The gauze and tape are too tight and cause your toe to become pale or numb.  Wear a protective shoe as directed by your health care provider. If you were not given a protective shoe, wear sturdy, supportive shoes. Your shoes should not pinch your toes and should not fit tightly against your toes.  Do not use any tobacco products, including cigarettes, chewing tobacco, or e-cigarettes. Tobacco can delay bone healing. If you need help quitting, ask your health care provider.  Take medicines only as directed by your health care provider.  Keep all follow-up visits as directed by your health care provider. This is important. SEEK MEDICAL CARE IF:  You have a fever.  Your pain medicine is not helping.  Your toe is cold.  Your toe is numb.  You still have pain after one week of rest and treatment.  You still have pain after your health care provider has said that you can start walking again.  You have pain, tingling, or numbness in your foot that is not going away. SEEK IMMEDIATE MEDICAL CARE IF:  You have severe pain.  You have redness or inflammation in your toe that is getting worse.  You have pain or numbness in your toe that is getting worse.  Your toe turns blue.   This information is not intended to replace advice given to you by your health care provider. Make sure you discuss any questions you have with your health care provider.   Document Released: 04/05/2000 Document Revised: 12/28/2014 Document Reviewed: 02/02/2014 Elsevier Interactive Patient Education [employer].

## 2015-05-21 NOTE — ED Notes (Signed)
Pt. reports persistent right great toe pain , seen here yesterday discharged home with prescription but lost his medications .
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
## 2015-05-21 NOTE — ED Notes (Signed)
Paged ortho tech.

## 2015-05-21 NOTE — Progress Notes (Signed)
Orthopedic Tech Progress Note Patient Details:  Bruce Koch November 10, 1970 [id number]  Ortho Devices Type of Ortho Device: Post (short leg) splint Ortho Device/Splint Location: rle Ortho Device/Splint Interventions: Ordered, Application   Karolee Stamps 05/21/2015, 5:24 AM

## 2015-05-21 NOTE — ED Notes (Signed)
Patient transported to X-ray 

## 2015-05-21 NOTE — ED Notes (Signed)
Per pt, he was seen yesterday at [facility] ED for a broken toe. Stated that he "bumped" his toe on something and it began to bleed.

## 2015-05-21 NOTE — ED Notes (Signed)
Pt outside.

## 2016-07-23 ENCOUNTER — Emergency Department (HOSPITAL_COMMUNITY)
Admission: EM | Admit: 2016-07-23 | Discharge: 2016-07-23 | Disposition: A | Discharge status: Home / Self Care | Payer: Medicaid Other | Attending: Emergency Medicine | Admitting: Emergency Medicine

## 2016-07-23 ENCOUNTER — Encounter (HOSPITAL_COMMUNITY): Payer: Self-pay | Admitting: Emergency Medicine

## 2016-07-23 DIAGNOSIS — Y9389 Activity, other specified: Secondary | ICD-10-CM | POA: Insufficient documentation

## 2016-07-23 DIAGNOSIS — Y92410 Unspecified street and highway as the place of occurrence of the external cause: Secondary | ICD-10-CM | POA: Insufficient documentation

## 2016-07-23 DIAGNOSIS — S161XXA Strain of muscle, fascia and tendon at neck level, initial encounter: Secondary | ICD-10-CM | POA: Insufficient documentation

## 2016-07-23 DIAGNOSIS — S39012A Strain of muscle, fascia and tendon of lower back, initial encounter: Secondary | ICD-10-CM | POA: Insufficient documentation

## 2016-07-23 DIAGNOSIS — Y999 Unspecified external cause status: Secondary | ICD-10-CM | POA: Insufficient documentation

## 2016-07-23 DIAGNOSIS — Z79899 Other long term (current) drug therapy: Secondary | ICD-10-CM | POA: Insufficient documentation

## 2016-07-23 DIAGNOSIS — F17200 Nicotine dependence, unspecified, uncomplicated: Secondary | ICD-10-CM | POA: Insufficient documentation

## 2016-07-23 MED ORDER — NAPROXEN 375 MG PO TABS: 375.0000 mg | ORAL_TABLET | Freq: Two times a day (BID) | ORAL | 0 refills | Status: DC

## 2016-07-23 MED ORDER — CYCLOBENZAPRINE HCL 10 MG PO TABS: 10.0000 mg | ORAL_TABLET | Freq: Two times a day (BID) | ORAL | 0 refills | Status: DC | PRN
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
## 2016-08-03 ENCOUNTER — Encounter (HOSPITAL_COMMUNITY): Payer: Self-pay

## 2016-08-03 ENCOUNTER — Emergency Department (HOSPITAL_COMMUNITY): Payer: No Typology Code available for payment source

## 2016-08-03 ENCOUNTER — Emergency Department (HOSPITAL_COMMUNITY)
Admission: EM | Admit: 2016-08-03 | Discharge: 2016-08-03 | Disposition: A | Discharge status: Home / Self Care | Payer: No Typology Code available for payment source | Attending: Emergency Medicine | Admitting: Emergency Medicine

## 2016-08-03 DIAGNOSIS — M546 Pain in thoracic spine: Secondary | ICD-10-CM

## 2016-08-03 DIAGNOSIS — M542 Cervicalgia: Secondary | ICD-10-CM | POA: Insufficient documentation

## 2016-08-03 DIAGNOSIS — S199XXA Unspecified injury of neck, initial encounter: Secondary | ICD-10-CM | POA: Diagnosis present

## 2016-08-03 DIAGNOSIS — F17200 Nicotine dependence, unspecified, uncomplicated: Secondary | ICD-10-CM | POA: Diagnosis not present

## 2016-08-03 DIAGNOSIS — Y939 Activity, unspecified: Secondary | ICD-10-CM | POA: Diagnosis not present

## 2016-08-03 DIAGNOSIS — Y999 Unspecified external cause status: Secondary | ICD-10-CM | POA: Insufficient documentation

## 2016-08-03 DIAGNOSIS — Y92410 Unspecified street and highway as the place of occurrence of the external cause: Secondary | ICD-10-CM | POA: Insufficient documentation

## 2016-08-03 DIAGNOSIS — G8929 Other chronic pain: Secondary | ICD-10-CM

## 2016-08-03 MED ORDER — METHOCARBAMOL 500 MG PO TABS: 500.0000 mg | ORAL_TABLET | Freq: Two times a day (BID) | ORAL | 0 refills | Status: DC
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
## 2018-06-08 ENCOUNTER — Other Ambulatory Visit: Payer: Self-pay

## 2018-06-08 ENCOUNTER — Emergency Department (HOSPITAL_COMMUNITY)
Admission: EM | Admit: 2018-06-08 | Discharge: 2018-06-09 | Disposition: A | Discharge status: Home / Self Care | Payer: No Typology Code available for payment source | Attending: Emergency Medicine | Admitting: Emergency Medicine

## 2018-06-08 ENCOUNTER — Emergency Department (HOSPITAL_COMMUNITY): Payer: No Typology Code available for payment source

## 2018-06-08 ENCOUNTER — Encounter (HOSPITAL_COMMUNITY): Payer: Self-pay

## 2018-06-08 DIAGNOSIS — Y939 Activity, unspecified: Secondary | ICD-10-CM | POA: Insufficient documentation

## 2018-06-08 DIAGNOSIS — M62838 Other muscle spasm: Secondary | ICD-10-CM | POA: Diagnosis not present

## 2018-06-08 DIAGNOSIS — Y929 Unspecified place or not applicable: Secondary | ICD-10-CM | POA: Diagnosis not present

## 2018-06-08 DIAGNOSIS — M25521 Pain in right elbow: Secondary | ICD-10-CM | POA: Diagnosis not present

## 2018-06-08 DIAGNOSIS — F17210 Nicotine dependence, cigarettes, uncomplicated: Secondary | ICD-10-CM | POA: Insufficient documentation

## 2018-06-08 DIAGNOSIS — M542 Cervicalgia: Secondary | ICD-10-CM

## 2018-06-08 DIAGNOSIS — Y999 Unspecified external cause status: Secondary | ICD-10-CM | POA: Insufficient documentation
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
## 2018-06-09 MED ORDER — CYCLOBENZAPRINE HCL 10 MG PO TABS: 10.0000 mg | ORAL_TABLET | Freq: Two times a day (BID) | ORAL | 0 refills | Status: DC | PRN

## 2018-06-09 NOTE — ED Notes (Addendum)
Pt to room. Asked to remove clothes and place gown on- pt states he doesn't want to get into a gown. Informed MD would like pt in gown for eval
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
## 2018-06-09 NOTE — Discharge Instructions (Signed)
Your exam was consistent with soft tissue and muscle skeletal injuries.  Your x-ray showed no elbow fracture or dislocation.  Please use the sling to help with comfort.  Please use the muscle relaxant help with your symptoms.  Please follow-up with your primary doctor and if any symptoms change or worsen, is return to the nearest emergency department.

## 2020-11-29 ENCOUNTER — Ambulatory Visit
Admission: EM | Admit: 2020-11-29 | Discharge: 2020-11-29 | Disposition: A | Discharge status: Home / Self Care | Payer: No Typology Code available for payment source | Attending: Family Medicine | Admitting: Family Medicine

## 2020-11-29 ENCOUNTER — Other Ambulatory Visit: Payer: Self-pay

## 2020-11-29 DIAGNOSIS — M5432 Sciatica, left side: Secondary | ICD-10-CM | POA: Diagnosis not present

## 2020-11-29 DIAGNOSIS — R030 Elevated blood-pressure reading, without diagnosis of hypertension: Secondary | ICD-10-CM

## 2020-11-29 MED ORDER — KETOROLAC TROMETHAMINE 60 MG/2ML IM SOLN
60.0000 mg | Freq: Once | INTRAMUSCULAR | Status: AC
  Administered 2020-11-29: 60 mg via INTRAMUSCULAR

## 2020-11-29 NOTE — ED Triage Notes (Signed)
Onset yesterday of left buttock pain that radiates down the posterior aspect of his left thigh stopping at knee. Denies n/t in BLE. Standing aggravates sxs. Has been taking ibuprofen with temporary relief. No RLE sxs. No falls or injuries noted.

## 2020-11-29 NOTE — Discharge Instructions (Addendum)
Your blood pressure was noted to be elevated during your visit today. If you are currently taking medication for high blood pressure, please ensure you are taking this as directed. If you do not have a history of high blood pressure and your blood pressure remains persistently elevated, you may need to begin taking a medication at some point. You may return here within the next few days to recheck if unable to see your primary care provider or if you do not have a one.  BP (!) 163/101 (BP Location: Right Arm)   Pulse 87   Temp 98.7 F (37.1 C) (Oral)   Resp 18   SpO2 95%   BP Readings from Last 3 Encounters:  11/29/20 (!) 163/101  06/09/18 (!) 142/76  08/03/16 (!) 153/99

## 2020-11-29 NOTE — ED Provider Notes (Signed)
[place]   [id number] 11/29/20 Arrival Time: 9833  ASSESSMENT & PLAN:  1. Left sided sciatica   2. Elevated blood pressure reading without diagnosis of hypertension    Will begin Rx Medrol Dosepak given by PCP today. Able to ambulate here and hemodynamically stable. No indication for imaging of back at this time given no trauma and normal neurological exam. Discussed.  Meds ordered this encounter  Medications   ketorolac (TORADOL) injection 60 mg   Encourage ROM/movement as tolerated.  Recommend:  Follow-up Information     Carole Civil, MD.   Specialties: Orthopedic Surgery, Radiology Why: If worsening or failing to improve as anticipated. Contact information: [address] [place] [address] [phone]                  Discharge Instructions      Your blood pressure was noted to be elevated during your visit today. If you are currently taking medication for high blood pressure, please ensure you are taking this as directed. If you do not have a history of high blood pressure and your blood pressure remains persistently elevated, you may need to begin taking a medication at some point. You may return here within the next few days to recheck if unable to see your primary care provider or if you do not have a one.  BP (!) 163/101 (BP Location: Right Arm)   Pulse 87   Temp 98.7 F (37.1 C) (Oral)   Resp 18   SpO2 95%   BP Readings from Last 3 Encounters:  11/29/20 (!) 163/101  06/09/18 (!) 142/76  08/03/16 (!) 153/99         Reviewed expectations re: course of current medical issues. Questions answered. Outlined signs and symptoms indicating need for more acute intervention. Patient verbalized understanding. After Visit Summary given.   SUBJECTIVE: History from: patient.  Jeffry Vogelsang is a 50 y.o. male who presents with complaint of intermittent L buttock/low back pain that radiates to L post thigh. No extremity  sensation changes or weakness. Pain worse with certain movements and prolonged standing. Normal bowel/bladder habits. Saw PCP today; given Rx Medrol Dosepak; hasn't taken yet. "Need something for the pain". Ambulatory without difficulty.  Increased blood pressure noted today. Reports that he has not been treated for hypertension in the past. He reports no chest pain on exertion, no dyspnea on exertion, no swelling of ankles, no orthostatic dizziness or lightheadedness, no orthopnea or paroxysmal nocturnal dyspnea, and no palpitations.  OBJECTIVE:  Vitals:   11/29/20 1730  BP: (!) 163/101  Pulse: 87  Resp: 18  Temp: 98.7 F (37.1 C)  TempSrc: Oral  SpO2: 95%    General appearance: alert; no distress HEENT: [place]; AT Neck: supple with FROM; without midline tenderness CV: regular Lungs: unlabored respirations; speaks full sentences without difficulty Abdomen: soft, non-tender; non-distended Back: moderate and poorly localized tenderness to palpation over upper L buttock ; FROM at waist; bruising: none; without midline tenderness Extremities: without edema; symmetrical without gross deformities; normal ROM of LLE; reports SLR on L with pain Skin: warm and dry Neurologic: normal gait; normal sensation and strength of LLE Psychological: alert and cooperative; normal mood and affect  No Known Allergies  Past Medical History:  Diagnosis Date   Depression 12/2013   Toe fracture    Social History   Socioeconomic History   Marital status: Single    Spouse name: Not on file   Number of children: 2  Years of education: 78    Highest education level: Not on file  Occupational History   Occupation: Unemployed   Tobacco Use   Smoking status: Every Day    Packs/day: 0.25    Types: Cigarettes, Cigars   Smokeless tobacco: Never  Substance and Sexual Activity   Alcohol use: Yes    Comment: rarely    Drug use: Not Currently    Types: Marijuana, Cocaine   Sexual activity: Yes  Other  Topics Concern   Not on file  Social History Narrative   Lives with wife (married since 2014) and 2 children age 7 and 75.    Social Determinants of Health   Financial Resource Strain: Not on file  Food Insecurity: Not on file  Transportation Needs: Not on file  Physical Activity: Not on file  Stress: Not on file  Social Connections: Not on file  Intimate Partner Violence: Not on file   Family History  Problem Relation Age of Onset   Diabetes Father    Drug abuse Father        cocaine overdose    Alcohol abuse Mother        history    Alcohol abuse Sister    Alcohol abuse Brother    History reviewed. No pertinent surgical history.    Vanessa Kick, MD 11/29/20 [phone]

## 2020-12-04 ENCOUNTER — Emergency Department (HOSPITAL_COMMUNITY)
Admission: EM | Admit: 2020-12-04 | Discharge: 2020-12-04 | Disposition: A | Discharge status: Home / Self Care | Payer: No Typology Code available for payment source | Attending: Emergency Medicine | Admitting: Emergency Medicine

## 2020-12-04 ENCOUNTER — Other Ambulatory Visit: Payer: Self-pay

## 2020-12-04 ENCOUNTER — Encounter (HOSPITAL_COMMUNITY): Payer: Self-pay

## 2020-12-04 DIAGNOSIS — F17210 Nicotine dependence, cigarettes, uncomplicated: Secondary | ICD-10-CM | POA: Diagnosis not present

## 2020-12-04 DIAGNOSIS — M25551 Pain in right hip: Secondary | ICD-10-CM | POA: Diagnosis present

## 2020-12-04 DIAGNOSIS — M5432 Sciatica, left side: Secondary | ICD-10-CM

## 2020-12-04 MED ORDER — DEXAMETHASONE SODIUM PHOSPHATE 10 MG/ML IJ SOLN
10.0000 mg | Freq: Once | INTRAMUSCULAR | Status: AC
  Administered 2020-12-04: 10 mg via INTRAMUSCULAR
  Filled 2020-12-04: qty 1

## 2020-12-04 MED ORDER — MELOXICAM 15 MG PO TABS: 15.0000 mg | ORAL_TABLET | Freq: Every day | ORAL | 0 refills | Status: DC

## 2020-12-04 NOTE — ED Provider Notes (Signed)
[facility] EMERGENCY DEPARTMENT Provider Note   CSN: [id number] Arrival date & time: 12/04/20  1909     History Chief Complaint  Patient presents with   Leg Pain    Bruce Koch is a 50 y.o. male.  Patient reports that he has been having pain behind his right hip radiating down the back of his right leg for 5 days.  He reports that he has to walk up 2 steps numerous times every day at work and this seems to aggravate the pain.  He reports that the pain is fine if he is sitting down but when he gets up, stands and walks is when the pain occurs.  No weakness in the lower extremities.  No loss of sensation.  No change in bowel or bladder function.  Patient reports that he did a telemedicine visit 2 days ago and was given an anti-inflammatory medication.  He was off the last 2 days and felt improved, when he went back to work today the pain started again.      Past Medical History:  Diagnosis Date   Depression 12/2013   Toe fracture     Patient Active Problem List   Diagnosis Date Noted   Urinary frequency 08/09/2014   GERD (gastroesophageal reflux disease) 08/09/2014   Tinea pedis 08/09/2014   Morbid obesity with body mass index of 45.0-49.9 in adult [facility]) 06/07/2014   Erectile dysfunction 06/07/2014   Depression 06/07/2014   Onychomycosis 06/07/2014   Tinea cruris 06/07/2014   Cutaneous skin tags 06/07/2014   History of alcohol abuse 06/07/2014   History of cocaine abuse ([place]) 06/07/2014   Current smoker 06/07/2014    History reviewed. No pertinent surgical history.     Family History  Problem Relation Age of Onset   Diabetes Father    Drug abuse Father        cocaine overdose    Alcohol abuse Mother        history    Alcohol abuse Sister    Alcohol abuse Brother     Social History   Tobacco Use   Smoking status: Every Day    Packs/day: 0.25    Types: Cigarettes, Cigars   Smokeless tobacco: Never  Substance Use Topics   Alcohol use: Yes    Comment:  rarely    Drug use: Not Currently    Types: Marijuana, Cocaine    Home Medications Prior to Admission medications   Medication Sig Start Date End Date Taking? Authorizing Provider  meloxicam (MOBIC) 15 MG tablet Take 1 tablet (15 mg total) by mouth daily. 12/04/20  Yes Shereda Graw, Gwenyth Allegra, MD  methylPREDNISolone (MEDROL DOSEPAK) 4 MG TBPK tablet Take by mouth. 11/29/20   [provider]  tamsulosin (FLOMAX) 0.4 MG CAPS capsule Take 0.4 mg by mouth daily. 11/21/20   [provider]    Allergies    Patient has no known allergies.  Review of Systems   Review of Systems  Physical Exam Updated Vital Signs BP (!) 166/95 (BP Location: Right Arm)   Pulse 80   Temp 98.6 F (37 C) (Oral)   Resp 17   Ht 6' 1"  (1.854 m)   Wt (!) 167.8 kg   SpO2 95%   BMI 48.82 kg/m   Physical Exam Vitals and nursing note reviewed.  Constitutional:      General: He is not in acute distress.    Appearance: Normal appearance. He is well-developed.  HENT:     Head: Normocephalic  and atraumatic.     Right Ear: Hearing normal.     Left Ear: Hearing normal.     Nose: Nose normal.  Eyes:     Conjunctiva/sclera: Conjunctivae normal.     Pupils: Pupils are equal, round, and reactive to light.  Cardiovascular:     Rate and Rhythm: Regular rhythm.     Heart sounds: S1 normal and S2 normal. No murmur heard.   No friction rub. No gallop.  Pulmonary:     Effort: Pulmonary effort is normal. No respiratory distress.     Breath sounds: Normal breath sounds.  Chest:     Chest wall: No tenderness.  Abdominal:     General: Bowel sounds are normal.     Palpations: Abdomen is soft.     Tenderness: There is no abdominal tenderness. There is no guarding or rebound. Negative signs include Murphy's sign and McBurney's sign.     Hernia: No hernia is present.  Musculoskeletal:     Cervical back: Normal range of motion and neck supple.     Lumbar back: Positive left straight leg raise test.      Comments: Normal strength and sensation bilateral lower extremities, no foot drop, no saddle anesthesia  Skin:    General: Skin is warm and dry.     Findings: No rash.  Neurological:     Mental Status: He is alert and oriented to person, place, and time.     GCS: GCS eye subscore is 4. GCS verbal subscore is 5. GCS motor subscore is 6.     Cranial Nerves: No cranial nerve deficit.     Sensory: No sensory deficit.     Coordination: Coordination normal.  Psychiatric:        Speech: Speech normal.        Behavior: Behavior normal.        Thought Content: Thought content normal.    ED Results / Procedures / Treatments   Labs (all labs ordered are listed, but only abnormal results are displayed) Labs Reviewed - No data to display  EKG None  Radiology No results found.  Procedures Procedures   Medications Ordered in ED Medications  dexamethasone (DECADRON) injection 10 mg (has no administration in time range)    ED Course  I have reviewed the triage vital signs and the nursing notes.  Pertinent labs & imaging results that were available during my care of the patient were reviewed by me and considered in my medical decision making (see chart for details).    MDM Rules/Calculators/A&P                           Patient presents to the ER with musculoskeletal back pain. Examination reveals back tenderness without any associated neurologic findings. Patient's strength, sensation and reflexes were normal. There is no evidence of saddle anesthesia. Patient does not have a foot drop. Patient has not experienced any change in bowel or bladder function. As such, patient did not require any imaging or further studies. Patient was treated with analgesia.  Records indicate that he was given a prednisone Dosepak at urgent care.  Final Clinical Impression(s) / ED Diagnoses Final diagnoses:  Sciatica of left side    Rx / DC Orders ED Discharge Orders          Ordered    meloxicam  (MOBIC) 15 MG tablet  Daily        12/04/20 2330  Orpah Greek, MD 12/04/20 [phone]

## 2020-12-04 NOTE — ED Triage Notes (Signed)
Pt. States their sciatica is flaring up and killing them.

## 2020-12-16 LAB — COLOGUARD: Cologuard: NEGATIVE

## 2021-05-24 ENCOUNTER — Emergency Department (HOSPITAL_COMMUNITY): Payer: PRIVATE HEALTH INSURANCE

## 2021-05-24 ENCOUNTER — Emergency Department (HOSPITAL_COMMUNITY)
Admission: EM | Admit: 2021-05-24 | Discharge: 2021-05-24 | Disposition: A | Discharge status: Home / Self Care | Payer: PRIVATE HEALTH INSURANCE | Attending: Emergency Medicine | Admitting: Emergency Medicine

## 2021-05-24 ENCOUNTER — Encounter (HOSPITAL_COMMUNITY): Payer: Self-pay | Admitting: Emergency Medicine

## 2021-05-24 ENCOUNTER — Ambulatory Visit
Admission: EM | Admit: 2021-05-24 | Discharge: 2021-05-24 | Disposition: A | Discharge status: Home / Self Care | Payer: PRIVATE HEALTH INSURANCE | Attending: Family Medicine | Admitting: Family Medicine

## 2021-05-24 ENCOUNTER — Other Ambulatory Visit: Payer: Self-pay

## 2021-05-24 DIAGNOSIS — M25551 Pain in right hip: Secondary | ICD-10-CM | POA: Insufficient documentation

## 2021-05-24 DIAGNOSIS — X501XXA Overexertion from prolonged static or awkward postures, initial encounter: Secondary | ICD-10-CM | POA: Diagnosis not present

## 2021-05-24 DIAGNOSIS — I10 Essential (primary) hypertension: Secondary | ICD-10-CM | POA: Insufficient documentation

## 2021-05-24 MED ORDER — HYDROCODONE-ACETAMINOPHEN 5-325 MG PO TABS
1.0000 | ORAL_TABLET | Freq: Once | ORAL | Status: AC
  Administered 2021-05-24: 1 via ORAL
  Filled 2021-05-24: qty 1

## 2021-05-24 MED ORDER — CYCLOBENZAPRINE HCL 10 MG PO TABS: 10.0000 mg | ORAL_TABLET | Freq: Two times a day (BID) | ORAL | 0 refills | Status: AC | PRN

## 2021-05-24 MED ORDER — DEXAMETHASONE SODIUM PHOSPHATE 10 MG/ML IJ SOLN
10.0000 mg | Freq: Once | INTRAMUSCULAR | Status: AC
  Administered 2021-05-24: 10 mg via INTRAMUSCULAR

## 2021-05-24 NOTE — ED Triage Notes (Signed)
Pt c/o right hip pain that started yesterday after he bent over to put on his shoes.

## 2021-05-24 NOTE — ED Provider Notes (Signed)
[facility] EMERGENCY DEPARTMENT Provider Note   CSN: [id number] Arrival date & time: 05/24/21  5465     History  Chief Complaint  Patient presents with   Hip Pain    Bruce Koch is a 51 y.o. male.  The history is provided by the patient.  Hip Pain This is a new problem. The current episode started yesterday. The problem occurs constantly. The problem has been gradually worsening. Pertinent negatives include no abdominal pain. The symptoms are aggravated by walking. The symptoms are relieved by rest.  Patient with history of hypertension and morbid obesity presents with right foot pain.  He denies trauma.  Patient reports he was bending over putting on his shoes when he had pain in his anterior hip.  No previous surgeries.  He has tried home medications without relief No weakness or numbness is reported in his lower extremities    Home Medications Prior to Admission medications   Medication Sig Start Date End Date Taking? Authorizing Provider  cyclobenzaprine (FLEXERIL) 10 MG tablet Take 1 tablet (10 mg total) by mouth 2 (two) times daily as needed for muscle spasms. 05/24/21  Yes Ripley Fraise, MD  tamsulosin (FLOMAX) 0.4 MG CAPS capsule Take 0.4 mg by mouth daily. 11/21/20   [provider]      Allergies    Patient has no known allergies.    Review of Systems   Review of Systems  Constitutional:  Negative for fever.  Gastrointestinal:  Negative for abdominal pain.  Musculoskeletal:  Positive for arthralgias. Negative for back pain and joint swelling.  Neurological:  Negative for weakness and numbness.   Physical Exam Updated Vital Signs BP (!) 184/104 Comment: states hasn't taken BP meds in 2 days   Pulse 82    Temp 97.9 F (36.6 C) (Oral)    Resp 20    Ht 1.854 m (6' 1" )    Wt (!) 176.9 kg    SpO2 95%    BMI 51.45 kg/m  Physical Exam CONSTITUTIONAL: Well developed/well nourished HEAD: Normocephalic/atraumatic EYES: EOMI/PERRL ENMT: Mucous membranes  moist NECK: supple no meningeal signs SPINE/BACK:entire spine nontender  ABDOMEN: soft, nontender, no rebound or guarding GU:no cva tenderness NEURO: Awake/alert, equal motor 5/5 strength noted with the following: hip flexion/knee flexion/extension, foot dorsi/plantar flexion, great toe extension intact bilaterally, no sensory deficit in any dermatome.   EXTREMITIES: pulses normal, full ROM Distal pulses equal and intact. Mild tenderness noted to right anterior hip region.  No erythema.  No bruising.  Full range of motion of right hip without difficulty. SKIN: warm, color normal PSYCH: no abnormalities of mood noted, alert and oriented to situation  ED Results / Procedures / Treatments   Labs (all labs ordered are listed, but only abnormal results are displayed) Labs Reviewed - No data to display  EKG None  Radiology DG Lumbar Spine Complete  Result Date: 05/24/2021 CLINICAL DATA:  Low back pain, no known injury, initial encounter EXAM: LUMBAR SPINE - COMPLETE 4+ VIEW COMPARISON:  None. FINDINGS: Five lumbar type vertebral bodies are well visualized. Mild osteophytic changes are noted. No pars defects are noted. No anterolisthesis is noted. No soft tissue abnormality is noted. IMPRESSION: Degenerative changes without acute abnormality. Electronically Signed   By: Inez Catalina M.D.   On: 05/24/2021 04:02   DG Hip Unilat W or Wo Pelvis 2-3 Views Right  Result Date: 05/24/2021 CLINICAL DATA:  Right hip pain common no known injury, initial encounter EXAM: DG HIP (WITH OR WITHOUT  PELVIS) 3V RIGHT COMPARISON:  None. FINDINGS: Pelvic ring is intact. No acute fracture or dislocation is noted. No soft tissue abnormality is seen. IMPRESSION: No acute abnormality noted. Electronically Signed   By: Inez Catalina M.D.   On: 05/24/2021 04:02    Procedures Procedures    Medications Ordered in ED Medications  HYDROcodone-acetaminophen (NORCO/VICODIN) 5-325 MG per tablet 1 tablet (1 tablet Oral Given  05/24/21 0017)    ED Course/ Medical Decision Making/ A&P                           Medical Decision Making Amount and/or Complexity of Data Reviewed Radiology: ordered.  Risk Prescription drug management.   Patient presents with right hip pain after bending over to put on his shoe.  Denies any falls or trauma.  He reports he can walk but it hurts.  No focal neurologic deficit is noted.  I personally reviewed the x-rays that are negative.  His history and exam are limited by his nonadherence to medications as well as his morbid obesity. Patient reports ibuprofen is not working.  I discussed appropriate use of ibuprofen as well as heating pad.  He is requesting additional medications.  I offered a muscle relaxant and referral to orthopedics.       Final Clinical Impression(s) / ED Diagnoses Final diagnoses:  Right hip pain    Rx / DC Orders ED Discharge Orders          Ordered    cyclobenzaprine (FLEXERIL) 10 MG tablet  2 times daily PRN        05/24/21 0439              Ripley Fraise, MD 05/24/21 [id number]

## 2021-05-24 NOTE — ED Triage Notes (Signed)
Patient states his right hip started hurting yesterday  Patient thinks he strained it   Patient states he went to the ER last night Hydrocodone and a flexeril and it worked until it wore off

## 2021-05-24 NOTE — ED Provider Notes (Signed)
[facility]    CSN: [id number] Arrival date & time: 05/24/21  1548      History   Chief Complaint Chief Complaint  Patient presents with   Hip Pain    Right hip pain    HPI Bruce Koch is a 51 y.o. male.   Presenting today with persistent right hip pain since yesterday.  Went to the emergency department yesterday after onset of symptoms, imaging of the lumbar spine, right hip both benign with no acute abnormalities and he was diagnosed with a muscle strain of the hip.  He states the hydrocodone was helpful until it wore off, Flexeril, over-the-counter pain relievers are not touching the pain and it seems to be more stiff and painful today and walking is very difficult.  He denies numbness, tingling, bowel or bladder incontinence, back pain, fever, chills, new injury to the area.   Past Medical History:  Diagnosis Date   Depression 12/2013   Toe fracture     Patient Active Problem List   Diagnosis Date Noted   Urinary frequency 08/09/2014   GERD (gastroesophageal reflux disease) 08/09/2014   Tinea pedis 08/09/2014   Morbid obesity with body mass index of 45.0-49.9 in adult [facility]) 06/07/2014   Erectile dysfunction 06/07/2014   Depression 06/07/2014   Onychomycosis 06/07/2014   Tinea cruris 06/07/2014   Cutaneous skin tags 06/07/2014   History of alcohol abuse 06/07/2014   History of cocaine abuse ([place]) 06/07/2014   Current smoker 06/07/2014    History reviewed. No pertinent surgical history.     Home Medications    Prior to Admission medications   Medication Sig Start Date End Date Taking? Authorizing Provider  cyclobenzaprine (FLEXERIL) 10 MG tablet Take 1 tablet (10 mg total) by mouth 2 (two) times daily as needed for muscle spasms. 05/24/21   Ripley Fraise, MD  tamsulosin (FLOMAX) 0.4 MG CAPS capsule Take 0.4 mg by mouth daily. 11/21/20   [provider]    Family History Family History  Problem Relation Age of Onset   Diabetes Father     Drug abuse Father        cocaine overdose    Alcohol abuse Mother        history    Alcohol abuse Sister    Alcohol abuse Brother     Social History Social History   Tobacco Use   Smoking status: Every Day    Types: Cigars   Smokeless tobacco: Never   Tobacco comments:    He smokes Black and Milds Daily  Vaping Use   Vaping Use: Never used  Substance Use Topics   Alcohol use: Yes    Comment: rarely    Drug use: Not Currently    Types: Marijuana, Cocaine     Allergies   Patient has no known allergies.   Review of Systems Review of Systems Per HPI  Physical Exam Triage Vital Signs ED Triage Vitals  Enc Vitals Group     BP 05/24/21 1609 (!) 194/107     Pulse Rate 05/24/21 1609 85     Resp 05/24/21 1609 18     Temp 05/24/21 1609 98.5 F (36.9 C)     Temp Source 05/24/21 1609 Oral     SpO2 05/24/21 1609 97 %     Weight --      Height --      Head Circumference --      Peak Flow --      Pain Score 05/24/21  1611 9     Pain Loc --      Pain Edu? --      Excl. in [place]? --    No data found.  Updated Vital Signs BP (!) 194/107 (BP Location: Right Arm)    Pulse 85    Temp 98.5 F (36.9 C) (Oral)    Resp 18    SpO2 97%   Visual Acuity Right Eye Distance:   Left Eye Distance:   Bilateral Distance:    Right Eye Near:   Left Eye Near:    Bilateral Near:     Physical Exam Vitals and nursing note reviewed.  Constitutional:      Appearance: Normal appearance. He is obese.  HENT:     Head: Atraumatic.  Eyes:     Extraocular Movements: Extraocular movements intact.     Conjunctiva/sclera: Conjunctivae normal.  Cardiovascular:     Rate and Rhythm: Normal rate and regular rhythm.  Pulmonary:     Effort: Pulmonary effort is normal.     Breath sounds: Normal breath sounds.  Musculoskeletal:        General: Tenderness present. No swelling or signs of injury. Normal range of motion.     Cervical back: Normal range of motion and neck supple.     Comments:  Tenderness to palpation right lateral hip musculature into the lateral right upper leg.  No midline spinal tenderness to palpation diffusely.  Range of motion full and intact all 4 extremities  Skin:    General: Skin is warm and dry.     Findings: No bruising or erythema.  Neurological:     General: No focal deficit present.     Mental Status: He is oriented to person, place, and time.     Motor: No weakness.  Psychiatric:        Mood and Affect: Mood normal.        Thought Content: Thought content normal.        Judgment: Judgment normal.     UC Treatments / Results  Labs (all labs ordered are listed, but only abnormal results are displayed) Labs Reviewed - No data to display  EKG   Radiology DG Lumbar Spine Complete  Result Date: 05/24/2021 CLINICAL DATA:  Low back pain, no known injury, initial encounter EXAM: LUMBAR SPINE - COMPLETE 4+ VIEW COMPARISON:  None. FINDINGS: Five lumbar type vertebral bodies are well visualized. Mild osteophytic changes are noted. No pars defects are noted. No anterolisthesis is noted. No soft tissue abnormality is noted. IMPRESSION: Degenerative changes without acute abnormality. Electronically Signed   By: Inez Catalina M.D.   On: 05/24/2021 04:02   DG Hip Unilat W or Wo Pelvis 2-3 Views Right  Result Date: 05/24/2021 CLINICAL DATA:  Right hip pain common no known injury, initial encounter EXAM: DG HIP (WITH OR WITHOUT PELVIS) 3V RIGHT COMPARISON:  None. FINDINGS: Pelvic ring is intact. No acute fracture or dislocation is noted. No soft tissue abnormality is seen. IMPRESSION: No acute abnormality noted. Electronically Signed   By: Inez Catalina M.D.   On: 05/24/2021 04:02    Procedures Procedures (including critical care time)  Medications Ordered in UC Medications  dexamethasone (DECADRON) injection 10 mg (10 mg Intramuscular Given 05/24/21 1651)    Initial Impression / Assessment and Plan / UC Course  I have reviewed the triage vital signs and  the nursing notes.  Pertinent labs & imaging results that were available during my care of the patient were reviewed  by me and considered in my medical decision making (see chart for details).     Consistent with muscular strain of the hip and upper leg, will add IM Decadron to Flexeril, over-the-counter pain relievers, stretches, heat regimen.  Already received work note from the emergency department yesterday.  Return for acutely worsening symptoms.  Final Clinical Impressions(s) / UC Diagnoses   Final diagnoses:  Right hip pain   Discharge Instructions   None    ED Prescriptions   None    PDMP not reviewed this encounter.   Volney American, [place] 05/24/21 1708

## 2021-09-04 ENCOUNTER — Other Ambulatory Visit: Payer: Self-pay

## 2021-09-04 ENCOUNTER — Encounter (HOSPITAL_COMMUNITY): Payer: Self-pay

## 2021-09-04 ENCOUNTER — Ambulatory Visit (INDEPENDENT_AMBULATORY_CARE_PROVIDER_SITE_OTHER): Payer: PRIVATE HEALTH INSURANCE

## 2021-09-04 ENCOUNTER — Ambulatory Visit
Admission: EM | Admit: 2021-09-04 | Discharge: 2021-09-04 | Disposition: A | Discharge status: Nursing Home (Includes ICF) | Payer: PRIVATE HEALTH INSURANCE | Attending: Family Medicine | Admitting: Family Medicine

## 2021-09-04 ENCOUNTER — Emergency Department (HOSPITAL_COMMUNITY)
Admission: EM | Admit: 2021-09-04 | Discharge: 2021-09-04 | Disposition: A | Discharge status: Home / Self Care | Payer: PRIVATE HEALTH INSURANCE | Attending: Emergency Medicine | Admitting: Emergency Medicine

## 2021-09-04 DIAGNOSIS — Y92810 Car as the place of occurrence of the external cause: Secondary | ICD-10-CM | POA: Insufficient documentation

## 2021-09-04 DIAGNOSIS — M79645 Pain in left finger(s): Secondary | ICD-10-CM

## 2021-09-04 DIAGNOSIS — W228XXA Striking against or struck by other objects, initial encounter: Secondary | ICD-10-CM | POA: Insufficient documentation

## 2021-09-04 DIAGNOSIS — Y9389 Activity, other specified: Secondary | ICD-10-CM | POA: Diagnosis not present

## 2021-09-04 DIAGNOSIS — Z23 Encounter for immunization: Secondary | ICD-10-CM | POA: Diagnosis not present

## 2021-09-04 DIAGNOSIS — I10 Essential (primary) hypertension: Secondary | ICD-10-CM | POA: Insufficient documentation

## 2021-09-04 HISTORY — DX: Essential (primary) hypertension: I10

## 2021-09-04 MED ORDER — ACETAMINOPHEN 500 MG PO TABS
500.0000 mg | ORAL_TABLET | Freq: Once | ORAL | Status: AC
  Administered 2021-09-04: 500 mg via ORAL

## 2021-09-04 MED ORDER — OXYCODONE-ACETAMINOPHEN 5-325 MG PO TABS
1.0000 | ORAL_TABLET | Freq: Once | ORAL | Status: AC
  Administered 2021-09-04: 1 via ORAL
  Filled 2021-09-04: qty 1

## 2021-09-04 MED ORDER — TETANUS-DIPHTH-ACELL PERTUSSIS 5-2.5-18.5 LF-MCG/0.5 IM SUSY
0.5000 mL | PREFILLED_SYRINGE | Freq: Once | INTRAMUSCULAR | Status: AC
  Administered 2021-09-04: 0.5 mL via INTRAMUSCULAR
  Filled 2021-09-04: qty 0.5

## 2021-09-04 MED ORDER — CEPHALEXIN 500 MG PO CAPS: 500.0000 mg | ORAL_CAPSULE | Freq: Two times a day (BID) | ORAL | 0 refills | Status: AC

## 2021-09-04 NOTE — ED Triage Notes (Signed)
Pt states pain to left second digit.  Thinks he has a piece of metal in his finger for the past week. ?

## 2021-09-04 NOTE — ED Provider Notes (Signed)
?[name] ?Provider Note ? ? ?CSN: [id number] ?Arrival date & time: 09/04/21  1007 ? ?  ? ?History ? ?Chief Complaint  ?Patient presents with  ? Hand Pain  ? ? ?Bruce Koch is a 51 y.o. male. ? ?HPI ? ?With medical history including hypertension presents with complaint of left index finger pain.  Patient states that pain started last night, came on suddenly, pain is mainly on his DIP joint on the medial aspect, pain is constant, worse with movement improved with rest, he states that last week he was changed the oil in his car and states that he hit the base of his finger against the car, he states that he sustained a small abrasion on his finger abrasion which is now  healing well, he states did not injure to the DIP joint originally.  States that he went to urgent care and was told to come here for further evaluation. ? ?Reviewed patient's chart seen in urgent care imaging was unremarkable and was sent here for further evaluation. ? ?Home Medications ?Prior to Admission medications   ?Medication Sig Start Date End Date Taking? Authorizing Provider  ?cephALEXin (KEFLEX) 500 MG capsule Take 1 capsule (500 mg total) by mouth 2 (two) times daily for 7 days. 09/04/21 09/11/21 Yes Marcello Fennel, PA-C  ?cyclobenzaprine (FLEXERIL) 10 MG tablet Take 1 tablet (10 mg total) by mouth 2 (two) times daily as needed for muscle spasms. 05/24/21   Ripley Fraise, MD  ?tamsulosin (FLOMAX) 0.4 MG CAPS capsule Take 0.4 mg by mouth daily. 11/21/20   [provider]  ?   ? ?Allergies    ?Patient has no known allergies.   ? ?Review of Systems   ?Review of Systems  ?Constitutional:  Negative for chills and fever.  ?Respiratory:  Negative for shortness of breath.   ?Cardiovascular:  Negative for chest pain.  ?Gastrointestinal:  Negative for abdominal pain.  ?Musculoskeletal:   ?     Left finger pain.  ?Neurological:  Negative for headaches.  ? ?Physical Exam ?Updated Vital Signs ?BP (!) 182/111   Pulse  (!) 59   Temp 97.8 ?F (36.6 ?C) (Oral)   Resp 18   Ht 6' 1"  (1.854 m)   Wt (!) 176.9 kg   SpO2 97%   BMI 51.45 kg/m?  ?Physical Exam ?Vitals and nursing note reviewed.  ?Constitutional:   ?   General: He is not in acute distress. ?   Appearance: He is not ill-appearing.  ?HENT:  ?   Head: Normocephalic and atraumatic.  ?   Nose: No congestion.  ?Eyes:  ?   Conjunctiva/sclera: Conjunctivae normal.  ?Cardiovascular:  ?   Rate and Rhythm: Normal rate and regular rhythm.  ?   Pulses: Normal pulses.  ?Musculoskeletal:  ?   Comments: Focused exam on the left hand, left index finger had a small nodule on the lateral aspect of the MCP joint, no surrounding erythema or edema no drainage or discharge present, area is nontender my exam.  He had some slight erythema over the lateral aspect of the DIP joint, tender on my exam no fluctuance or induration noted, he has limited flexion extension at the DIP joint but is moving all joints at difficulty.  Please see picture for full detail.  ?Skin: ?   General: Skin is warm and dry.  ?Neurological:  ?   Mental Status: He is alert.  ?Psychiatric:     ?   Mood and Affect: Mood normal.  ? ? ? ? ? ? ? ?  ED Results / Procedures / Treatments   ?Labs ?(all labs ordered are listed, but only abnormal results are displayed) ?Labs Reviewed - No data to display ? ?EKG ?None ? ?Radiology ?DG Hand Complete Left ? ?Result Date: 09/04/2021 ?CLINICAL DATA:  Left index finger pain and swelling since injury a week ago. EXAM: LEFT HAND - COMPLETE 3+ VIEW COMPARISON:  Left index finger x-rays dated October 21, 2009. FINDINGS: There is no evidence of fracture or dislocation. There is no evidence of arthropathy or other focal bone abnormality. Soft tissues are unremarkable. No radiopaque foreign body identified. IMPRESSION: Negative. Electronically Signed   By: Titus Dubin M.D.   On: 09/04/2021 09:31   ? ?Procedures ?Procedures  ? ? ?Medications Ordered in ED ?Medications  ?Tdap (BOOSTRIX) injection 0.5  mL (0.5 mLs Intramuscular Given 09/04/21 1223)  ?oxyCODONE-acetaminophen (PERCOCET/ROXICET) 5-325 MG per tablet 1 tablet (1 tablet Oral Given 09/04/21 1223)  ? ? ?ED Course/ Medical Decision Making/ A&P ?  ?                        ?Medical Decision Making ?Risk ?Prescription drug management. ? ? ?This patient presents to the ED for concern of finger pain, this involves an extensive number of treatment options, and is a complaint that carries with it a high risk of complications and morbidity.  The differential diagnosis includes fracture, dislocation, infection ? ? ? ?Additional history obtained: ? ?Additional history obtained from N/A ?External records from outside source obtained and reviewed including previous urgent care note, imaging ? ? ?Co morbidities that complicate the patient evaluation ? ?Hypertension ? ?Social Determinants of Health: ? ?N/A ? ? ? ?Lab Tests: ? ?I Ordered, and personally interpreted labs.  The pertinent results include: N/A ? ? ?Imaging Studies ordered: ? ?I ordered imaging studies including N/A ?I independently visualized and interpreted imaging which showed N/A ?I agree with the radiologist interpretation ? ? ?Cardiac Monitoring: ? ?The patient was maintained on a cardiac monitor.  I personally viewed and interpreted the cardiac monitored which showed an underlying rhythm of: N/A ? ? ?Medicines ordered and prescription drug management: ? ?I ordered medication including tetanus shot, oxycodone for pain ?I have reviewed the patients home medicines and have made adjustments as needed ? ?Critical Interventions: ? ?N/A ? ? ?Reevaluation: ? ?Presents with left index finger pain, patient obtain imaging at urgent care which was unremarkable, he has a benign physical exam, possibly suffering from overlying cellulitis will start him on antibiotics.  He is given this plan is ready for discharge. ? ?Consultations Obtained: ?N/A ? ? ? ?Test Considered: ? ?N/A ? ? ? ?Rule out ?I have low suspicion for  septic arthritis as patient denies IV drug use, skin exam was performed no erythematous, edematous, warm joints noted on exam, no new heart murmur heard on exam.  Low suspicion for fracture or dislocation as x-ray does not feel any significant findings. low suspicion for ligament or tendon damage as area was palpated no gross defects noted, they had full range of motion as well as 5/5 strength.  Low suspicion for compartment syndrome as area was palpated it was soft to the touch, neurovascular fully intact. ? ? ? ? ?Dispostion and problem list ? ?After consideration of the diagnostic results and the patients response to treatment, I feel that the patent would benefit from discharge. ? ?Left finger pain-unclear etiology but I am concerned for possible overlying cellulitis as he does have some erythema  over the area, is tender to palpation, there is no fluctuance, my suspicion for abscess is low at this time.  It is possible it could be gout but this is a atypical presentation.   will put him on antibiotics, follow-up with orthopedics for further evaluation and strict return precautions. ? ? ? ? ? ? ? ? ? ? ? ?Final Clinical Impression(s) / ED Diagnoses ?Final diagnoses:  ?Finger pain, left  ? ? ?Rx / DC Orders ?ED Discharge Orders   ? ?      Ordered  ?  cephALEXin (KEFLEX) 500 MG capsule  2 times daily       ? 09/04/21 1225  ? ?  ?  ? ?  ? ? ?  ?Marcello Fennel, PA-C ?09/04/21 1226 ? ?  ?Daleen Bo, MD ?09/04/21 1748 ? ?

## 2021-09-04 NOTE — ED Notes (Signed)
Patient is being discharged from the Urgent Care and sent to the Emergency Department via POV . Per PA, patient is in need of higher level of care due to deep hand infection. Patient is aware and verbalizes understanding of plan of care.  ?Vitals:  ? 09/04/21 0902  ?BP: (!) 153/101  ?Pulse: 72  ?Resp: 18  ?Temp: 98 ?F (36.7 ?C)  ?SpO2: 92%  ?  ?

## 2021-09-04 NOTE — Discharge Instructions (Signed)
I am concerned you might have a slight infection of your left finger, have started you on antibiotics please take as prescribed.  You may use over-the-counter pain medication as needed.  If you notice after 2 days of taking this antibiotics your pain is gotten worse, you have worsening swelling redness or red streaks up your arm you must come back in for reevaluation. ? ?I recommend they follow-up with orthopedics for further evaluation. ? ?Come back to the emergency department if you develop chest pain, shortness of breath, severe abdominal pain, uncontrolled nausea, vomiting, diarrhea. ? ?

## 2021-09-04 NOTE — ED Triage Notes (Signed)
Patient states pain to left 2nd finger with pain extending up arm. Patient states he was working under a car and injured finger. Patient sent here from UC due to possible infection. Patient does not have streaking to arm.  ?

## 2021-09-08 NOTE — ED Provider Notes (Signed)
[facility]    CSN: [id number] Arrival date & time: 09/04/21  6010      History   Chief Complaint Chief Complaint  Patient presents with   Finger Injury    HPI GRYPHON VANDERVEEN is a 51 y.o. male.   Patient presenting today with left index finger pain, diffuse swelling and now decreased range of motion in the finger with pain going up to the elbow.  Thinks he may have gotten a piece of metal stuck in the finger last week.  He states the symptoms have significantly worsened over the past 24 hours which is what brought him in today.  He denies fever, chills, redness, drainage.  Not trying anything over-the-counter for symptoms other than Tylenol 3 which has barely controlled his pain today.   Past Medical History:  Diagnosis Date   Depression 12/21/2013   Hypertension    Toe fracture     Patient Active Problem List   Diagnosis Date Noted   Urinary frequency 08/09/2014   GERD (gastroesophageal reflux disease) 08/09/2014   Tinea pedis 08/09/2014   Morbid obesity with body mass index of 45.0-49.9 in adult [facility]) 06/07/2014   Erectile dysfunction 06/07/2014   Depression 06/07/2014   Onychomycosis 06/07/2014   Tinea cruris 06/07/2014   Cutaneous skin tags 06/07/2014   History of alcohol abuse 06/07/2014   History of cocaine abuse ([place]) 06/07/2014   Current smoker 06/07/2014    History reviewed. No pertinent surgical history.     Home Medications    Prior to Admission medications   Medication Sig Start Date End Date Taking? Authorizing Provider  cephALEXin (KEFLEX) 500 MG capsule Take 1 capsule (500 mg total) by mouth 2 (two) times daily for 7 days. 09/04/21 09/11/21  Marcello Fennel, PA-C  cyclobenzaprine (FLEXERIL) 10 MG tablet Take 1 tablet (10 mg total) by mouth 2 (two) times daily as needed for muscle spasms. 05/24/21   Ripley Fraise, MD  tamsulosin (FLOMAX) 0.4 MG CAPS capsule Take 0.4 mg by mouth daily. 11/21/20   [provider]    Family  History Family History  Problem Relation Age of Onset   Diabetes Father    Drug abuse Father        cocaine overdose    Alcohol abuse Mother        history    Alcohol abuse Sister    Alcohol abuse Brother     Social History Social History   Tobacco Use   Smoking status: Every Day    Types: Cigars   Smokeless tobacco: Never   Tobacco comments:    He smokes Black and Milds Daily  Vaping Use   Vaping Use: Never used  Substance Use Topics   Alcohol use: Yes    Comment: rarely    Drug use: Not Currently    Types: Marijuana, Cocaine     Allergies   Patient has no known allergies.   Review of Systems Review of Systems Per HPI  Physical Exam Triage Vital Signs ED Triage Vitals  Enc Vitals Group     BP 09/04/21 0902 (!) 153/101     Pulse Rate 09/04/21 0902 72     Resp 09/04/21 0902 18     Temp 09/04/21 0902 98 F (36.7 C)     Temp Source 09/04/21 0902 Oral     SpO2 09/04/21 0902 92 %     Weight 09/04/21 0903 (!) 390 lb (176.9 kg)     Height 09/04/21 0903 6' 1"  (  1.854 m)     Head Circumference --      Peak Flow --      Pain Score 09/04/21 0902 7     Pain Loc --      Pain Edu? --      Excl. in [place]? --    No data found.  Updated Vital Signs BP (!) 153/101 (BP Location: Right Arm)   Pulse 72   Temp 98 F (36.7 C) (Oral)   Resp 18   Ht 6' 1"  (1.854 m)   Wt (!) 390 lb (176.9 kg)   SpO2 92%   BMI 51.45 kg/m   Visual Acuity Right Eye Distance:   Left Eye Distance:   Bilateral Distance:    Right Eye Near:   Left Eye Near:    Bilateral Near:     Physical Exam Vitals and nursing note reviewed.  Constitutional:      Appearance: Normal appearance.  HENT:     Head: Atraumatic.  Eyes:     Extraocular Movements: Extraocular movements intact.     Conjunctiva/sclera: Conjunctivae normal.  Cardiovascular:     Rate and Rhythm: Normal rate and regular rhythm.  Pulmonary:     Effort: Pulmonary effort is normal.     Breath sounds: Normal breath sounds.   Musculoskeletal:        General: Swelling, tenderness and signs of injury present. No deformity. Normal range of motion.     Cervical back: Normal range of motion and neck supple.     Comments: Significantly decreased range of motion to the left index finger, tenderness to palpation of the forearm toward the left elbow.  Severe pain shooting to left elbow with range of motion of the left index finger  Skin:    General: Skin is warm.     Findings: No bruising or erythema.     Comments: Nodular region to the left index finger, unclear if representative of a foreign body or wound.  Diffuse edema surrounding the area  Neurological:     General: No focal deficit present.     Motor: No weakness.     Gait: Gait normal.     Comments: Left hand neurovascularly intact  Psychiatric:        Mood and Affect: Mood normal.        Thought Content: Thought content normal.        Judgment: Judgment normal.     UC Treatments / Results  Labs (all labs ordered are listed, but only abnormal results are displayed) Labs Reviewed - No data to display  EKG   Radiology No results found.  Procedures Procedures (including critical care time)  Medications Ordered in UC Medications  acetaminophen (TYLENOL) tablet 500 mg (500 mg Oral Given by Other 09/04/21 0946)    Initial Impression / Assessment and Plan / UC Course  I have reviewed the triage vital signs and the nursing notes.  Pertinent labs & imaging results that were available during my care of the patient were reviewed by me and considered in my medical decision making (see chart for details).     Tylenol given for pain in triage, no bony abnormality or foreign body appreciable on x-ray of the left hand.  Concern for a deep fascial infection at this time given the severe worsening symptoms and pain shooting up to elbow, decreased range of motion.  Recommended emergency department for consideration of soft tissue imaging to rule this out.   Patient agreeable and wishes to  go via private vehicle.  Final Clinical Impressions(s) / UC Diagnoses   Final diagnoses:  Pain of finger of left hand   Discharge Instructions   None    ED Prescriptions   None    PDMP not reviewed this encounter.   Merrie Roof [place], [place] 09/08/21 [phone]

## 2022-05-30 ENCOUNTER — Emergency Department (HOSPITAL_COMMUNITY)
Admission: EM | Admit: 2022-05-30 | Discharge: 2022-05-30 | Disposition: A | Discharge status: Home / Self Care | Payer: BLUE CROSS/BLUE SHIELD | Attending: Emergency Medicine | Admitting: Emergency Medicine

## 2022-05-30 ENCOUNTER — Other Ambulatory Visit: Payer: Self-pay

## 2022-05-30 ENCOUNTER — Emergency Department (HOSPITAL_COMMUNITY): Payer: BLUE CROSS/BLUE SHIELD

## 2022-05-30 ENCOUNTER — Encounter (HOSPITAL_COMMUNITY): Payer: Self-pay

## 2022-05-30 DIAGNOSIS — M25571 Pain in right ankle and joints of right foot: Secondary | ICD-10-CM | POA: Insufficient documentation

## 2022-05-30 DIAGNOSIS — I10 Essential (primary) hypertension: Secondary | ICD-10-CM | POA: Insufficient documentation

## 2022-05-30 MED ORDER — PREDNISONE 50 MG PO TABS: 50.0000 mg | ORAL_TABLET | Freq: Every day | ORAL | 0 refills | Status: DC

## 2022-05-30 MED ORDER — IBUPROFEN 800 MG PO TABS
800.0000 mg | ORAL_TABLET | Freq: Once | ORAL | Status: AC
  Administered 2022-05-30: 800 mg via ORAL
  Filled 2022-05-30: qty 1

## 2022-05-30 MED ORDER — AMLODIPINE BESYLATE 5 MG PO TABS: 5.0000 mg | ORAL_TABLET | Freq: Every day | ORAL | 0 refills | Status: DC

## 2022-05-30 MED ORDER — AMLODIPINE BESYLATE 5 MG PO TABS
10.0000 mg | ORAL_TABLET | Freq: Once | ORAL | Status: AC
  Administered 2022-05-30: 5 mg via ORAL
  Filled 2022-05-30: qty 2

## 2022-05-30 MED ORDER — PREDNISONE 20 MG PO TABS
60.0000 mg | ORAL_TABLET | Freq: Once | ORAL | Status: AC
  Administered 2022-05-30: 60 mg via ORAL
  Filled 2022-05-30: qty 3

## 2022-05-30 NOTE — Care Management (Addendum)
Transition of Care [facility]) - Emergency Department Mini Assessment   Patient Details  Name: Bruce Koch MRN: [id number] Date of Birth: 12-20-1970  Transition of Care [facility]) CM/SW Contact:    Roseanne Kaufman, RN Phone Number: 05/30/2022, 2:37 PM   Clinical Narrative: Received TOC consult for medication assistance patient recently lost job and has no insurance. Per chart review the patient does have insurance and not eligible for [facility] program. This RNCM spoke with [place] who will provide patient with discount coupon resulting in medications cost of $3.87. This RNCM sp[oke with patient at bedside to advise he is not eligible for [facility] program. Patient reports he can afford the $3.87 copay at [facility] in [place].  Notified EDP. No additional TOC needs at this time. TOC signing off.  - 2:29 This RNCM spoke EDP who reports he will also prescribe Amolodipine 5mg  #30.  This RNCM spoke [facility] in [place] who reports the Amolodipine will cost $0.   No additional TOC needs ED Mini Assessment: What brought you to the Emergency Department? : ankle pain  Barriers to Discharge: No Barriers Identified  Barrier interventions: medication review  Means of departure: Car  Interventions which prevented an admission or readmission: Medication Review    Patient Contact and Communications        ,              Choice offered to / list presented to : Patient  Admission diagnosis:  rt ankle inj, pain and swelling Patient Active Problem List   Diagnosis Date Noted   Urinary frequency 08/09/2014   GERD (gastroesophageal reflux disease) 08/09/2014   Tinea pedis 08/09/2014   Morbid obesity with body mass index of 45.0-49.9 in adult [facility]) 06/07/2014   Erectile dysfunction 06/07/2014   Depression 06/07/2014   Onychomycosis 06/07/2014   Tinea cruris 06/07/2014   Cutaneous skin tags 06/07/2014   History of alcohol abuse 06/07/2014   History of cocaine  abuse ([place]) 06/07/2014   Current smoker 06/07/2014   PCP:  Alanson Puls [facility]:   [facility] [phone] - [place], [place] AT [place] [address] [place] [address] Phone: [phone] Fax: [phone]

## 2022-05-30 NOTE — ED Provider Notes (Addendum)
[facility] Provider Note   CSN: [id number] Arrival date & time: 05/30/22  1144     History  Chief Complaint  Patient presents with   Ankle Pain    Bruce Koch is a 52 y.o. male with medical history of depression, hypertension, toe fracture.  Patient presents to ED for evaluation of right ankle pain.  Patient reports that beginning yesterday he developed right-sided ankle pain.  Patient denies trauma or event to account for this pain.  Patient reports remote history of pain in the ankle a couple of months ago however reports that after 2 days that dissipated.  Patient reports that this time, pain is excruciating.  Rates his pain 8 out of 10.  Patient reports pain worse with ambulation.  Denies fevers, nausea or vomiting, numbness or tingling in the right foot.  Denies overlying skin changes to right ankle.   Ankle Pain Associated symptoms: no fever        Home Medications Prior to Admission medications   Medication Sig Start Date End Date Taking? Authorizing Provider  predniSONE (DELTASONE) 50 MG tablet Take 1 tablet (50 mg total) by mouth daily. 05/30/22  Yes Azucena Cecil, PA-C  cyclobenzaprine (FLEXERIL) 10 MG tablet Take 1 tablet (10 mg total) by mouth 2 (two) times daily as needed for muscle spasms. 05/24/21   Ripley Fraise, MD  tamsulosin (FLOMAX) 0.4 MG CAPS capsule Take 0.4 mg by mouth daily. 11/21/20   [provider]      Allergies    Patient has no known allergies.    Review of Systems   Review of Systems  Constitutional:  Negative for fever.  Gastrointestinal:  Negative for nausea and vomiting.  Musculoskeletal:  Positive for arthralgias.  Skin:  Negative for color change.  All other systems reviewed and are negative.   Physical Exam Updated Vital Signs BP (!) 160/109 (BP Location: Right Arm)   Pulse 69   Temp 98.1 F (36.7 C) (Oral)   Resp 19   Wt (!) 176 kg   SpO2 99%   BMI 51.19 kg/m   Physical Exam Vitals and nursing note reviewed.  Constitutional:      General: He is not in acute distress.    Appearance: Normal appearance. He is not ill-appearing, toxic-appearing or diaphoretic.  HENT:     Head: Normocephalic and atraumatic.     Nose: Nose normal. No congestion.     Mouth/Throat:     Mouth: Mucous membranes are moist.     Pharynx: Oropharynx is clear.  Eyes:     Extraocular Movements: Extraocular movements intact.     Conjunctiva/sclera: Conjunctivae normal.     Pupils: Pupils are equal, round, and reactive to light.  Cardiovascular:     Rate and Rhythm: Normal rate and regular rhythm.  Pulmonary:     Effort: Pulmonary effort is normal.     Breath sounds: Normal breath sounds. No wheezing.  Abdominal:     General: Abdomen is flat. Bowel sounds are normal.     Palpations: Abdomen is soft.     Tenderness: There is no abdominal tenderness.  Musculoskeletal:     Cervical back: Normal range of motion and neck supple. No tenderness.     Right ankle: Swelling present.     Comments: Slight soft tissue swelling to lateral right ankle, no overlying skin change.  Patient has full range of motion of ankle.  Skin:    General: Skin is warm  and dry.     Capillary Refill: Capillary refill takes less than 2 seconds.  Neurological:     Mental Status: He is alert and oriented to person, place, and time.     ED Results / Procedures / Treatments   Labs (all labs ordered are listed, but only abnormal results are displayed) Labs Reviewed - No data to display  EKG None  Radiology DG Ankle Complete Right  Result Date: 05/30/2022 CLINICAL DATA:  Pain and swelling. EXAM: RIGHT ANKLE - COMPLETE 3 VIEW COMPARISON:  None Available. FINDINGS: There is no evidence of fracture, dislocation, or joint effusion. There is no evidence of arthropathy or other focal bone abnormality. Soft tissue swelling noted at the lateral malleolus. IMPRESSION: Soft tissue swelling. No acute osseous  abnormality identified. Electronically Signed   By: Sammie Bench M.D.   On: 05/30/2022 13:03    Procedures Procedures    Medications Ordered in ED Medications  ibuprofen (ADVIL) tablet 800 mg (800 mg Oral Given 05/30/22 1225)  predniSONE (DELTASONE) tablet 60 mg (60 mg Oral Given 05/30/22 1340)  amLODipine (NORVASC) tablet 10 mg (5 mg Oral Given 05/30/22 1421)    ED Course/ Medical Decision Making/ A&P  Medical Decision Making Amount and/or Complexity of Data Reviewed Radiology: ordered.  Risk Prescription drug management.   52 year old male presents to ED for evaluation of atraumatic right ankle pain.  Please see HPI for further details.  On examination patient afebrile, nontachycardic.  Lung sounds clear bilaterally, he is not hypoxic on room air.  Abdomen soft and compressible throughout.  Right ankle has intact range of motion, no overlying skin change, there is slight soft tissue swelling to the lateral portion of his ankle.  Range of motion maintained.  Plain film imaging of patient right lower leg shows soft tissue swelling however no fracture or other pathology identified.  Patient provided steroids for suspected gout flare.  Patient reports history of pain in this area.  On chart review it appears that the patient has been treated for gout in the past.  Patient will be provided with steroids here, discharged with 5 days of steroids and referred to orthopedics.  On chart review, it appears the patient had increased creatinine on last metabolic panel so we will will not treat with NSAIDs. Patient provided return precautions and he voiced understanding.  Patient had all of his questions answered to his satisfaction.  The patient stable for discharge at this time.  Update: Prior to discharge, patient blood pressure noted to be elevated.  Patient upon further questioning states that he is recently ran out of blood pressure medication secondary to having lost his job and not having  insurance.  Social work consult placed at this time.  Hulan Amato, RN case manager has set the patient up with medication assistance.  Patient discharged in stable condition.   Final Clinical Impression(s) / ED Diagnoses Final diagnoses:  Acute right ankle pain    Rx / DC Orders ED Discharge Orders          Ordered    predniSONE (DELTASONE) 50 MG tablet  Daily        05/30/22 1401              Azucena Cecil, PA-C 05/30/22 1402    Azucena Cecil, PA-C 05/30/22 1449    Ezequiel Essex, MD 05/30/22 1553

## 2022-05-30 NOTE — Discharge Instructions (Addendum)
Return to the ED with any new or worsening signs or symptoms such as decreased range of motion to your right ankle, fevers Please follow-up with orthopedics.  Please call and make an appointment to be seen. Please pick up steroids I have sent in.  You will begin taking these tomorrow as you have received steroids here today. Please see attached work note Please see attached guide concerning ankle pain

## 2022-05-30 NOTE — ED Triage Notes (Signed)
C/o right ankle pain x1 day that is worse with palpation.  Denies trauma/fall

## 2022-06-12 ENCOUNTER — Ambulatory Visit: Payer: PRIVATE HEALTH INSURANCE | Admitting: Orthopedic Surgery

## 2022-06-20 ENCOUNTER — Encounter: Payer: Self-pay | Admitting: Radiology

## 2022-07-15 ENCOUNTER — Ambulatory Visit (INDEPENDENT_AMBULATORY_CARE_PROVIDER_SITE_OTHER): Payer: No Typology Code available for payment source | Admitting: Family Medicine

## 2022-07-15 ENCOUNTER — Encounter: Payer: Self-pay | Admitting: Family Medicine

## 2022-07-15 VITALS — BP 168/106 | HR 76 | Ht 73.0 in | Wt 363.0 lb

## 2022-07-15 DIAGNOSIS — E785 Hyperlipidemia, unspecified: Secondary | ICD-10-CM

## 2022-07-15 DIAGNOSIS — R350 Frequency of micturition: Secondary | ICD-10-CM

## 2022-07-15 DIAGNOSIS — E039 Hypothyroidism, unspecified: Secondary | ICD-10-CM

## 2022-07-15 DIAGNOSIS — I10 Essential (primary) hypertension: Secondary | ICD-10-CM

## 2022-07-15 DIAGNOSIS — R7301 Impaired fasting glucose: Secondary | ICD-10-CM | POA: Diagnosis not present

## 2022-07-15 DIAGNOSIS — Z1159 Encounter for screening for other viral diseases: Secondary | ICD-10-CM

## 2022-07-15 DIAGNOSIS — Z125 Encounter for screening for malignant neoplasm of prostate: Secondary | ICD-10-CM

## 2022-07-15 MED ORDER — OLMESARTAN MEDOXOMIL-HCTZ 40-12.5 MG PO TABS: 1.0000 | ORAL_TABLET | Freq: Every day | ORAL | 1 refills | Status: DC

## 2022-07-15 MED ORDER — AMLODIPINE BESYLATE 10 MG PO TABS: 10.0000 mg | ORAL_TABLET | Freq: Every day | ORAL | 1 refills | Status: DC

## 2022-07-15 MED ORDER — BLOOD PRESSURE KIT DEVI: 1.0000 | Freq: Once | 0 refills | Status: DC | PRN

## 2022-07-15 MED ORDER — AMLODIPINE BESYLATE 5 MG PO TABS: 5.0000 mg | ORAL_TABLET | Freq: Every day | ORAL | 1 refills | Status: DC

## 2022-07-15 NOTE — Patient Instructions (Signed)
It was pleasure meeting with you today. Please take medications as prescribed. Follow up with your primary health provider if any health concerns arises.  

## 2022-07-15 NOTE — Assessment & Plan Note (Addendum)
Vitals:   07/15/22 1514 07/15/22 1518  BP: (!) 174/106 (!) 168/106   Blood pressure not controlled. Prescribed Olmesartan-HCTZ 40-12.5 mg, continue amlodipine 5 mg daily. Labs ordered. Explained non pharmacological interventions such as low salt, DASH diet discussed. Educated on stress reduction and physical activity. Discussed signs and symptoms of major cardiovascular event and need to present to the ED. Follow up in 2 weeks with blood pressure reading logs. Patient verbalizes understanding regarding plan of care and all questions answered.

## 2022-07-15 NOTE — Progress Notes (Signed)
New Patient Office Visit   Subjective   Patient ID: Bruce Koch, male    DOB: Apr 28, 1970  Age: 52 y.o. MRN: [id number]  CC:  Chief Complaint  Patient presents with   Establish Care   Hypertension    HPI Bruce Koch 52 year old male, presents to establish care. He  has a past medical history of Depression (12/21/2013), Hypertension, and Toe fracture.  Patient here for elevated blood pressure. He is exercising and is not adherent to low salt diet.  Blood pressure unknown if  well controlled at home. Patient denies cardiac symptoms chest pain, chest pressure/discomfort, exertional chest pressure/discomfort, lower extremity edema, palpitations, syncope, and tachypnea..Cardiovascular risk factors: hypertension, male gender, obesity (BMI >= 30 kg/m2), sedentary lifestyle, and smoking/ tobacco exposure.    Outpatient Encounter Medications as of 07/15/2022  Medication Sig   Blood Pressure Monitoring (BLOOD PRESSURE KIT) DEVI 1 kit by Does not apply route once as needed for up to 1 dose.   olmesartan-hydrochlorothiazide (BENICAR HCT) 40-12.5 MG tablet Take 1 tablet by mouth daily.   [DISCONTINUED] amLODipine (NORVASC) 10 MG tablet Take 1 tablet (10 mg total) by mouth daily.   [DISCONTINUED] amLODipine (NORVASC) 5 MG tablet Take 1 tablet (5 mg total) by mouth daily.   amLODipine (NORVASC) 5 MG tablet Take 1 tablet (5 mg total) by mouth daily.   cyclobenzaprine (FLEXERIL) 10 MG tablet Take 1 tablet (10 mg total) by mouth 2 (two) times daily as needed for muscle spasms.   predniSONE (DELTASONE) 50 MG tablet Take 1 tablet (50 mg total) by mouth daily.   tamsulosin (FLOMAX) 0.4 MG CAPS capsule Take 0.4 mg by mouth daily.   No facility-administered encounter medications on file as of 07/15/2022.    History reviewed. No pertinent surgical history.  Review of Systems  Constitutional:  Negative for fever.  HENT:  Negative for ear pain.   Eyes:  Negative for blurred vision.   Respiratory:  Negative for shortness of breath.   Cardiovascular:  Negative for chest pain.  Gastrointestinal:  Negative for abdominal pain.  Genitourinary:  Positive for urgency.       PSA lab ordered   Neurological:  Negative for dizziness.      Objective    BP (!) 168/106   Pulse 76   Ht 6\' 1"  (1.854 m)   Wt (!) 363 lb (164.7 kg)   SpO2 95%   BMI 47.89 kg/m   Physical Exam Constitutional:      Appearance: He is obese.  HENT:     Head: Normocephalic.  Cardiovascular:     Rate and Rhythm: Normal rate and regular rhythm.     Pulses: Normal pulses.     Heart sounds: Normal heart sounds.  Pulmonary:     Effort: Pulmonary effort is normal.     Breath sounds: Normal breath sounds.  Musculoskeletal:        General: Normal range of motion.  Skin:    General: Skin is warm and dry.     Capillary Refill: Capillary refill takes less than 2 seconds.  Neurological:     General: No focal deficit present.     Mental Status: He is alert.  Psychiatric:        Mood and Affect: Mood normal.        Thought Content: Thought content normal.       Assessment & Plan:  Primary hypertension Assessment & Plan: Vitals:   07/15/22 1514 07/15/22 1518  BP: Marland Kitchen)  174/106 (!) 168/106   Blood pressure not controlled. Prescribed Olmesartan-HCTZ 40-12.5 mg, continue amlodipine 5 mg daily. Labs ordered. Explained non pharmacological interventions such as low salt, DASH diet discussed. Educated on stress reduction and physical activity. Discussed signs and symptoms of major cardiovascular event and need to present to the ED. Follow up in 2 weeks with blood pressure reading logs. Patient verbalizes understanding regarding plan of care and all questions answered.   Orders: -     CBC with Differential/Platelet -     CMP14+EGFR -     Microalbumin / creatinine urine ratio -     Olmesartan Medoxomil-HCTZ; Take 1 tablet by mouth daily.  Dispense: 90 tablet; Refill: 1 -     Blood Pressure Kit; 1 kit  by Does not apply route once as needed for up to 1 dose.  Dispense: 1 each; Refill: 0 -     amLODIPine Besylate; Take 1 tablet (5 mg total) by mouth daily.  Dispense: 90 tablet; Refill: 1  IFG (impaired fasting glucose) -     Hemoglobin A1c  Hyperlipidemia, unspecified hyperlipidemia type -     Lipid panel  Hypothyroidism, unspecified type -     TSH + free T4  Need for hepatitis C screening test -     Hepatitis C antibody  Screening for prostate cancer -     PSA  Urine frequency -     Urinalysis    Return in 2 weeks (on 07/29/2022) for re-check blood pressure, hypertension, Weight Loss Mangment, urine frequency.   Renard Hamper Ria Comment, FNP

## 2022-07-20 LAB — URINALYSIS
Bilirubin, UA: NEGATIVE
Glucose, UA: NEGATIVE
Ketones, UA: NEGATIVE
Leukocytes,UA: NEGATIVE
Nitrite, UA: NEGATIVE
RBC, UA: NEGATIVE
Specific Gravity, UA: >=1.03 — AB (ref 1.005–1.030)
Urobilinogen, Ur: 0.2 mg/dL (ref 0.2–1.0)
pH, UA: 5.5 (ref 5.0–7.5)

## 2022-07-21 LAB — HEPATITIS C ANTIBODY: Hep C Virus Ab: NONREACTIVE

## 2022-07-21 LAB — CMP14+EGFR
ALT: 41 IU/L (ref 0–44)
AST: 40 IU/L (ref 0–40)
Albumin/Globulin Ratio: 1.8 (ref 1.2–2.2)
Albumin: 4 g/dL (ref 3.8–4.9)
Alkaline Phosphatase: 83 IU/L (ref 44–121)
BUN/Creatinine Ratio: 16 (ref 9–20)
BUN: 19 mg/dL (ref 6–24)
Bilirubin Total: 0.2 mg/dL (ref 0.0–1.2)
CO2: 22 mmol/L (ref 20–29)
Calcium: 8.9 mg/dL (ref 8.7–10.2)
Chloride: 101 mmol/L (ref 96–106)
Creatinine, Ser: 1.16 mg/dL (ref 0.76–1.27)
Globulin, Total: 2.2 g/dL (ref 1.5–4.5)
Glucose: 111 mg/dL — ABNORMAL HIGH (ref 70–99)
Potassium: 4.4 mmol/L (ref 3.5–5.2)
Sodium: 139 mmol/L (ref 134–144)
Total Protein: 6.2 g/dL (ref 6.0–8.5)
eGFR: 76 mL/min/{1.73_m2} (ref >59)

## 2022-07-21 LAB — CBC WITH DIFFERENTIAL/PLATELET
Basophils Absolute: 0 10*3/uL (ref 0.0–0.2)
Basos: 1 %
EOS (ABSOLUTE): 0.2 10*3/uL (ref 0.0–0.4)
Eos: 4 %
Hematocrit: 51.1 % — ABNORMAL HIGH (ref 37.5–51.0)
Hemoglobin: 16.7 g/dL (ref 13.0–17.7)
Immature Grans (Abs): 0 10*3/uL (ref 0.0–0.1)
Immature Granulocytes: 0 %
Lymphocytes Absolute: 1.9 10*3/uL (ref 0.7–3.1)
Lymphs: 36 %
MCH: 28.4 pg (ref 26.6–33.0)
MCHC: 32.7 g/dL (ref 31.5–35.7)
MCV: 87 fL (ref 79–97)
Monocytes Absolute: 0.6 10*3/uL (ref 0.1–0.9)
Monocytes: 11 %
Neutrophils Absolute: 2.5 10*3/uL (ref 1.4–7.0)
Neutrophils: 48 %
Platelets: 230 10*3/uL (ref 150–450)
RBC: 5.88 x10E6/uL — ABNORMAL HIGH (ref 4.14–5.80)
RDW: 14.4 % (ref 11.6–15.4)
WBC: 5.2 10*3/uL (ref 3.4–10.8)

## 2022-07-21 LAB — MICROALBUMIN / CREATININE URINE RATIO
Creatinine, Urine: 317.3 mg/dL
Microalb/Creat Ratio: 7 mg/g creat (ref 0–29)
Microalbumin, Urine: 21.4 ug/mL

## 2022-07-21 LAB — HEMOGLOBIN A1C
Est. average glucose Bld gHb Est-mCnc: 120 mg/dL
Hgb A1c MFr Bld: 5.8 % — ABNORMAL HIGH (ref 4.8–5.6)

## 2022-07-21 LAB — TSH+FREE T4
Free T4: 1.05 ng/dL (ref 0.82–1.77)
TSH: 0.983 u[IU]/mL (ref 0.450–4.500)

## 2022-07-21 LAB — LIPID PANEL
Chol/HDL Ratio: 4.9 ratio (ref 0.0–5.0)
Cholesterol, Total: 215 mg/dL — ABNORMAL HIGH (ref 100–199)
HDL: 44 mg/dL (ref >39)
LDL Chol Calc (NIH): 117 mg/dL — ABNORMAL HIGH (ref 0–99)
Triglycerides: 313 mg/dL — ABNORMAL HIGH (ref 0–149)
VLDL Cholesterol Cal: 54 mg/dL — ABNORMAL HIGH (ref 5–40)

## 2022-07-21 LAB — PSA: Prostate Specific Ag, Serum: 0.5 ng/mL (ref 0.0–4.0)

## 2022-07-22 ENCOUNTER — Other Ambulatory Visit: Payer: Self-pay | Admitting: Family Medicine

## 2022-07-22 MED ORDER — ROSUVASTATIN CALCIUM 20 MG PO TABS: 20.0000 mg | ORAL_TABLET | Freq: Every day | ORAL | 3 refills | Status: DC

## 2022-07-22 NOTE — Progress Notes (Signed)
Please inform patient Hemoglobin A1c 5.9 indicates pre-diabetes and Cholesterol levels elevated, advise lifestyle modifications follow diet low in saturated fat, reduce dietary salt intake, avoid fatty foods, maintain an exercise routine 3 to 5 days a week for a minimum total of 150 minutes.  Prescribed Crestor 20 mg as initial therapy for cholesterol management. I encourage over the counter omega-3 fish oil 1000 mg twice daily.

## 2022-07-29 ENCOUNTER — Ambulatory Visit (INDEPENDENT_AMBULATORY_CARE_PROVIDER_SITE_OTHER): Payer: No Typology Code available for payment source | Admitting: Family Medicine

## 2022-07-29 ENCOUNTER — Encounter: Payer: Self-pay | Admitting: Family Medicine

## 2022-07-29 VITALS — BP 129/78 | HR 90 | Ht 73.0 in | Wt 370.0 lb

## 2022-07-29 DIAGNOSIS — Z6841 Body Mass Index (BMI) 40.0 and over, adult: Secondary | ICD-10-CM

## 2022-07-29 DIAGNOSIS — I10 Essential (primary) hypertension: Secondary | ICD-10-CM | POA: Diagnosis not present

## 2022-07-29 DIAGNOSIS — E669 Obesity, unspecified: Secondary | ICD-10-CM | POA: Insufficient documentation

## 2022-07-29 NOTE — Patient Instructions (Signed)
It was pleasure meeting with you today. Please take medications as prescribed. Follow up with your primary health provider if any health concerns arises.  

## 2022-07-29 NOTE — Progress Notes (Signed)
Patient Office Visit   Subjective   Patient ID: Bruce Koch, male    DOB: [birth year]  Age: 52 y.o. MRN: [id number]  CC:  Chief Complaint  Patient presents with   Hypertension    Patient is here for HTN f/u. Does not have a machine at home.     HPI Bruce Koch 52 year old male, presents to clinic for hypertension follow up . He  has a past medical history of Depression (12/21/2013), Hypertension, and Toe fracture.For the details of today's visit, please refer to the assessment and plan.  Hypertension The problem has been gradually improving since onset. The problem is controlled. Pertinent negatives include no blurred vision, chest pain, headaches, neck pain, orthopnea or shortness of breath. Risk factors for coronary artery disease include dyslipidemia, obesity, male gender, smoking/tobacco exposure, sedentary lifestyle and stress. Past treatments include angiotensin blockers, calcium channel blockers and diuretics. The current treatment provides moderate improvement. Compliance problems include diet and exercise.       Outpatient Encounter Medications as of 07/29/2022  Medication Sig   amLODipine (NORVASC) 10 MG tablet Take 10 mg by mouth daily.   Blood Pressure Monitoring (BLOOD PRESSURE KIT) DEVI 1 kit by Does not apply route once as needed for up to 1 dose.   cyclobenzaprine (FLEXERIL) 10 MG tablet Take 1 tablet (10 mg total) by mouth 2 (two) times daily as needed for muscle spasms.   olmesartan-hydrochlorothiazide (BENICAR HCT) 40-12.5 MG tablet Take 1 tablet by mouth daily.   predniSONE (DELTASONE) 50 MG tablet Take 1 tablet (50 mg total) by mouth daily.   rosuvastatin (CRESTOR) 20 MG tablet Take 1 tablet (20 mg total) by mouth daily.   tamsulosin (FLOMAX) 0.4 MG CAPS capsule Take 0.4 mg by mouth daily.   amLODipine (NORVASC) 5 MG tablet Take 1 tablet (5 mg total) by mouth daily.   No facility-administered encounter medications on file as of 07/29/2022.    History  reviewed. No pertinent surgical history.  Review of Systems  Constitutional:  Negative for chills and fever.  Eyes:  Negative for blurred vision.  Respiratory:  Negative for shortness of breath.   Cardiovascular:  Negative for chest pain and orthopnea.  Gastrointestinal:  Negative for abdominal pain.  Musculoskeletal:  Negative for neck pain.  Neurological:  Negative for dizziness and headaches.      Objective    BP 129/78   Pulse 90   Ht 6\' 1"  (1.854 m)   Wt (!) 370 lb (167.8 kg)   SpO2 93%   BMI 48.82 kg/m   Physical Exam Vitals reviewed.  Constitutional:      General: He is not in acute distress.    Appearance: Normal appearance. He is obese. He is not ill-appearing, toxic-appearing or diaphoretic.  Eyes:     General:        Right eye: No discharge.        Left eye: No discharge.     Conjunctiva/sclera: Conjunctivae normal.  Cardiovascular:     Rate and Rhythm: Normal rate and regular rhythm.     Heart sounds: Normal heart sounds.  Pulmonary:     Effort: Pulmonary effort is normal. No respiratory distress.     Breath sounds: Normal breath sounds.  Musculoskeletal:        General: Normal range of motion.     Cervical back: Normal range of motion.  Skin:    General: Skin is warm and dry.  Neurological:     General:  No focal deficit present.     Mental Status: He is alert and oriented to person, place, and time. Mental status is at baseline.  Psychiatric:        Mood and Affect: Mood normal.        Behavior: Behavior normal.        Thought Content: Thought content normal.        Judgment: Judgment normal.       Assessment & Plan:  Class 3 severe obesity due to excess calories with serious comorbidity and body mass index (BMI) of 45.0 to 49.9 in adult  Primary hypertension Assessment & Plan: Vitals:   07/29/22 1433 07/29/22 1439  BP: 131/85 129/78   Blood pressure controlled today's visit Continue amlodipine 10 mg and olmesartan-hctz 40-12.5 mg DASH diet  discussed   Morbid obesity with body mass index of 45.0-49.9 in adult Assessment & Plan: Referral to Provider Exercise Program Discussed lifestyle modifications follow diet low in saturated fat, reduce dietary salt intake, avoid fatty foods, maintain an exercise routine 3 to 5 days a week for a minimum total of 150 minutes.  Follow up in 2 weeks for weight loss management      Return in about 2 weeks (around 08/12/2022) for Weight Loss Mangment.   Cruzita Lederer Newman Nip, FNP

## 2022-07-29 NOTE — Assessment & Plan Note (Signed)
Referral to Provider Exercise Program Discussed lifestyle modifications follow diet low in saturated fat, reduce dietary salt intake, avoid fatty foods, maintain an exercise routine 3 to 5 days a week for a minimum total of 150 minutes.  Follow up in 2 weeks for weight loss management

## 2022-07-29 NOTE — Assessment & Plan Note (Signed)
Vitals:   07/29/22 1433 07/29/22 1439  BP: 131/85 129/78   Blood pressure controlled today's visit Continue amlodipine 10 mg and olmesartan-hctz 40-12.5 mg DASH diet discussed

## 2022-08-12 ENCOUNTER — Ambulatory Visit: Payer: No Typology Code available for payment source | Admitting: Family Medicine

## 2022-08-13 ENCOUNTER — Encounter: Payer: Self-pay | Admitting: Family Medicine

## 2022-10-08 ENCOUNTER — Ambulatory Visit: Payer: No Typology Code available for payment source | Admitting: Internal Medicine

## 2022-10-10 ENCOUNTER — Ambulatory Visit: Payer: No Typology Code available for payment source | Admitting: Internal Medicine

## 2022-10-17 ENCOUNTER — Encounter: Payer: Self-pay | Admitting: Internal Medicine

## 2022-10-17 ENCOUNTER — Ambulatory Visit (INDEPENDENT_AMBULATORY_CARE_PROVIDER_SITE_OTHER): Payer: No Typology Code available for payment source | Admitting: Internal Medicine

## 2022-10-17 VITALS — BP 142/82 | HR 66 | Ht 73.0 in | Wt 362.8 lb

## 2022-10-17 DIAGNOSIS — I10 Essential (primary) hypertension: Secondary | ICD-10-CM

## 2022-10-17 DIAGNOSIS — R7303 Prediabetes: Secondary | ICD-10-CM | POA: Diagnosis not present

## 2022-10-17 LAB — POCT GLYCOSYLATED HEMOGLOBIN (HGB A1C): Hemoglobin A1C: 5.6 % (ref 4.0–5.6)

## 2022-10-17 MED ORDER — OLMESARTAN MEDOXOMIL-HCTZ 40-12.5 MG PO TABS: 1.0000 | ORAL_TABLET | Freq: Every day | ORAL | 1 refills | Status: DC

## 2022-10-17 NOTE — Assessment & Plan Note (Signed)
Patient presents today for an acute visit requesting repeat A1c.  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  A1c was previously updated in March, 5.8, consistent with prediabetes.  Repeat A1c today has improved to 5.6. -Today's lab result has been present off and provided to the patient for him to present at DOT follow-up next week.

## 2022-10-17 NOTE — Assessment & Plan Note (Signed)
Olmesartan-HCTZ refilled today

## 2022-10-17 NOTE — Patient Instructions (Signed)
It was a pleasure to see you today.  Thank you for giving [place] the opportunity to be involved in your care.  Below is a brief recap of your visit and next steps.  We will plan to see you again in 4 weeks.  Summary A1c completed today Follow up with PCP in 2-4 weeks

## 2022-10-17 NOTE — Progress Notes (Signed)
Acute Office Visit  Subjective:     Patient ID: Bruce Koch, male    DOB: Jun 09, 1970, 52 y.o.   MRN: [id number]  Chief Complaint  Patient presents with   Hemoglobin A1c Screening    Bruce Koch presents today for an acute visit requesting repeat A1c.  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  It was recommended that he follow-up with his PCP for diabetes mellitus screening.  He is currently asymptomatic, requests a refill of olmesartan-HCTZ, but otherwise has no acute concerns to discuss today.  Review of Systems  Constitutional:  Negative for chills and fever.  HENT:  Negative for sore throat.   Respiratory:  Negative for cough and shortness of breath.   Cardiovascular:  Negative for chest pain, palpitations and leg swelling.  Gastrointestinal:  Negative for abdominal pain, blood in stool, constipation, diarrhea, nausea and vomiting.  Genitourinary:  Negative for dysuria and hematuria.  Musculoskeletal:  Negative for myalgias.  Skin:  Negative for itching and rash.  Neurological:  Negative for dizziness and headaches.  Psychiatric/Behavioral:  Negative for depression and suicidal ideas.       Objective:    BP (!) 142/82   Pulse 66   Ht 6\' 1"  (1.854 m)   Wt (!) 362 lb 12.8 oz (164.6 kg)   SpO2 94%   BMI 47.87 kg/m   Physical Exam Vitals reviewed.  Constitutional:      General: He is not in acute distress.    Appearance: Normal appearance. He is obese. He is not ill-appearing.  HENT:     Head: Normocephalic and atraumatic.     Right Ear: External ear normal.     Left Ear: External ear normal.     Nose: Nose normal. No congestion or rhinorrhea.     Mouth/Throat:     Mouth: Mucous membranes are moist.     Pharynx: Oropharynx is clear.  Eyes:     General: No scleral icterus.    Extraocular Movements: Extraocular movements intact.     Conjunctiva/sclera: Conjunctivae normal.     Pupils: Pupils are equal, round, and reactive to light.   Cardiovascular:     Rate and Rhythm: Normal rate and regular rhythm.     Pulses: Normal pulses.     Heart sounds: Normal heart sounds. No murmur heard. Pulmonary:     Effort: Pulmonary effort is normal.     Breath sounds: Normal breath sounds. No wheezing, rhonchi or rales.  Abdominal:     General: Abdomen is flat. Bowel sounds are normal. There is no distension.     Palpations: Abdomen is soft.     Tenderness: There is no abdominal tenderness.  Musculoskeletal:        General: No swelling or deformity. Normal range of motion.     Cervical back: Normal range of motion.  Skin:    General: Skin is warm and dry.     Capillary Refill: Capillary refill takes less than 2 seconds.  Neurological:     General: No focal deficit present.     Mental Status: He is alert and oriented to person, place, and time.     Motor: No weakness.  Psychiatric:        Mood and Affect: Mood normal.        Behavior: Behavior normal.        Thought Content: Thought content normal.       Assessment & Plan:   Problem List Items Addressed This  Visit       Primary hypertension    Olmesartan-HCTZ refilled today      Prediabetes - Primary    Patient presents today for an acute visit requesting repeat A1c.  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  A1c was previously updated in March, 5.8, consistent with prediabetes.  Repeat A1c today has improved to 5.6. -Today's lab result has been present off and provided to the patient for him to present at DOT follow-up next week.       Meds ordered this encounter  Medications   olmesartan-hydrochlorothiazide (BENICAR HCT) 40-12.5 MG tablet    Sig: Take 1 tablet by mouth daily.    Dispense:  90 tablet    Refill:  1    Return in about 2 weeks (around 10/31/2022).  Billie Lade, MD

## 2022-11-07 ENCOUNTER — Ambulatory Visit: Payer: No Typology Code available for payment source | Admitting: Family Medicine

## 2022-11-20 ENCOUNTER — Ambulatory Visit: Payer: No Typology Code available for payment source | Admitting: Family Medicine

## 2022-11-20 ENCOUNTER — Encounter: Payer: Self-pay | Admitting: Family Medicine

## 2022-11-20 VITALS — BP 134/80 | HR 87 | Ht 73.0 in | Wt 360.0 lb

## 2022-11-20 DIAGNOSIS — M79671 Pain in right foot: Secondary | ICD-10-CM | POA: Insufficient documentation

## 2022-11-20 MED ORDER — PREDNISONE 20 MG PO TABS: 20.0000 mg | ORAL_TABLET | Freq: Two times a day (BID) | ORAL | 0 refills | Status: AC

## 2022-11-20 NOTE — Progress Notes (Signed)
Patient Office Visit   Subjective   Patient ID: Bruce Koch, male    DOB: 08-Nov-1970  Age: 52 y.o. MRN: [id number]  CC:  Chief Complaint  Patient presents with   Foot Swelling    Patient complains of R foot and leg pain with foot swelling starting last Thursday.     HPI Bruce Koch 52 year old male, presents to the clinic for right foot pain starting Thursday now worsening. He  has a past medical history of Depression (12/21/2013), Hypertension, and Toe fracture.  Right Foot Pain There was no injury mechanism. The pain is present in the right foot. The quality of the pain is described as aching, burning and cramping. The pain is at a severity of 10/10. The pain has been Constant since onset. Associated symptoms include a loss of sensation, muscle weakness, numbness and tingling. He reports no foreign bodies present. The symptoms are aggravated by movement and weight bearing. He has tried ice, heat and acetaminophen for the symptoms. The treatment provided no relief.        Outpatient Encounter Medications as of 11/20/2022  Medication Sig   amLODipine (NORVASC) 10 MG tablet Take 10 mg by mouth daily.   amLODipine (NORVASC) 5 MG tablet Take 1 tablet (5 mg total) by mouth daily.   Blood Pressure Monitoring (BLOOD PRESSURE KIT) DEVI 1 kit by Does not apply route once as needed for up to 1 dose.   cyclobenzaprine (FLEXERIL) 10 MG tablet Take 1 tablet (10 mg total) by mouth 2 (two) times daily as needed for muscle spasms.   olmesartan-hydrochlorothiazide (BENICAR HCT) 40-12.5 MG tablet Take 1 tablet by mouth daily.   predniSONE (DELTASONE) 20 MG tablet Take 1 tablet (20 mg total) by mouth 2 (two) times daily with a meal for 5 days.   rosuvastatin (CRESTOR) 20 MG tablet Take 1 tablet (20 mg total) by mouth daily.   tamsulosin (FLOMAX) 0.4 MG CAPS capsule Take 0.4 mg by mouth daily.   [DISCONTINUED] predniSONE (DELTASONE) 50 MG tablet Take 1 tablet (50 mg total) by mouth daily.    No facility-administered encounter medications on file as of 11/20/2022.    No past surgical history on file.  Review of Systems  Constitutional:  Negative for chills and fever.  Eyes:  Negative for blurred vision.  Respiratory:  Negative for shortness of breath.   Cardiovascular:  Negative for chest pain.  Musculoskeletal:  Positive for joint pain and myalgias. Negative for falls.  Neurological:  Negative for dizziness.      Objective    BP 134/80   Pulse 87   Ht 6\' 1"  (1.854 m)   Wt (!) 360 lb (163.3 kg)   SpO2 94%   BMI 47.50 kg/m   Physical Exam Vitals reviewed.  Constitutional:      General: He is not in acute distress.    Appearance: Normal appearance. He is not ill-appearing, toxic-appearing or diaphoretic.  HENT:     Head: Normocephalic.  Eyes:     General:        Right eye: No discharge.        Left eye: No discharge.     Conjunctiva/sclera: Conjunctivae normal.  Cardiovascular:     Rate and Rhythm: Normal rate.     Pulses: Normal pulses.     Heart sounds: Normal heart sounds.  Pulmonary:     Effort: Pulmonary effort is normal. No respiratory distress.     Breath sounds: Normal breath sounds.  Musculoskeletal:        General: Tenderness present. Normal range of motion.     Cervical back: Normal range of motion.     Right lower leg: Edema present.     Left lower leg: No edema.  Skin:    General: Skin is warm and dry.     Capillary Refill: Capillary refill takes less than 2 seconds.  Neurological:     General: No focal deficit present.     Mental Status: He is alert and oriented to person, place, and time.     Coordination: Coordination normal.     Gait: Gait normal.  Psychiatric:        Mood and Affect: Mood normal.        Behavior: Behavior normal.        Thought Content: Thought content normal.       Assessment & Plan:  Right foot pain Assessment & Plan: Xray ordered- awaiting results will follow up Patient following up with Ortho on 8/6   Prednisone 20 mg twice daily for 5 days Advise icing, stretching and modifying activities that cause pain, shoe inserts and NSAIDs for pain management. Discussed theThe RICE method: R: Rest the painful area for a few days. I: Ice the area for 20 minutes at a time to relieve inflammation. C: Compress the area with a soft wrap to reduce swelling. E: Elevate the area by putting the foot on a few pillows.   Orders: -     predniSONE; Take 1 tablet (20 mg total) by mouth 2 (two) times daily with a meal for 5 days.  Dispense: 10 tablet; Refill: 0 -     DG Foot Complete Right; Future    Return in about 2 months (around 01/20/2023), or if symptoms worsen or fail to improve, for hypertension, hyperlipidemia.   Cruzita Lederer Newman Nip, FNP

## 2022-11-20 NOTE — Patient Instructions (Signed)
        Great to see you today.   - Please take medications as prescribed. - Follow up with your primary health provider if any health concerns arises. - If symptoms worsen please contact your primary care provider and/or visit the emergency department.  

## 2022-11-20 NOTE — Assessment & Plan Note (Addendum)
Xray ordered- awaiting results will follow up Patient following up with Ortho on 8/6  Prednisone 20 mg twice daily for 5 days Advise icing, stretching and modifying activities that cause pain, shoe inserts and NSAIDs for pain management. Discussed theThe RICE method: R: Rest the painful area for a few days. I: Ice the area for 20 minutes at a time to relieve inflammation. C: Compress the area with a soft wrap to reduce swelling. E: Elevate the area by putting the foot on a few pillows.

## 2022-11-26 ENCOUNTER — Ambulatory Visit: Payer: No Typology Code available for payment source | Admitting: Orthopedic Surgery

## 2023-01-16 ENCOUNTER — Other Ambulatory Visit: Payer: Self-pay | Admitting: Family Medicine

## 2023-01-16 DIAGNOSIS — I10 Essential (primary) hypertension: Secondary | ICD-10-CM

## 2023-01-20 ENCOUNTER — Ambulatory Visit: Payer: No Typology Code available for payment source | Admitting: Family Medicine

## 2023-01-22 ENCOUNTER — Telehealth: Payer: Self-pay | Admitting: Family Medicine

## 2023-01-22 NOTE — Telephone Encounter (Signed)
Prescription Request  01/22/2023  LOV: 11/20/2022  What is the name of the medication or equipment? amLODipine (NORVASC) 10 MG tablet [[id number]]   Have you contacted your pharmacy to request a refill? No   Which pharmacy would you like this sent to?  [facility] [phone] - [place], [place] - [address] AT [facility] [address] [place] [address] Phone: [phone] Fax: [phone]    Patient notified that their request is being sent to the clinical staff for review and that they should receive a response within 2 business days.   Please advise at Mobile [phone] (mobile)

## 2023-01-22 NOTE — Telephone Encounter (Signed)
Refill sent on 9/26.

## 2023-01-27 ENCOUNTER — Ambulatory Visit: Payer: No Typology Code available for payment source | Admitting: Family Medicine

## 2023-01-29 ENCOUNTER — Ambulatory Visit: Payer: No Typology Code available for payment source | Admitting: Family Medicine

## 2023-02-06 ENCOUNTER — Ambulatory Visit (INDEPENDENT_AMBULATORY_CARE_PROVIDER_SITE_OTHER): Payer: No Typology Code available for payment source | Admitting: Family Medicine

## 2023-02-06 ENCOUNTER — Encounter: Payer: Self-pay | Admitting: Family Medicine

## 2023-02-06 VITALS — BP 153/101 | HR 77 | Wt 360.9 lb

## 2023-02-06 DIAGNOSIS — E559 Vitamin D deficiency, unspecified: Secondary | ICD-10-CM

## 2023-02-06 DIAGNOSIS — I10 Essential (primary) hypertension: Secondary | ICD-10-CM

## 2023-02-06 DIAGNOSIS — E538 Deficiency of other specified B group vitamins: Secondary | ICD-10-CM

## 2023-02-06 DIAGNOSIS — E291 Testicular hypofunction: Secondary | ICD-10-CM | POA: Diagnosis not present

## 2023-02-06 DIAGNOSIS — D509 Iron deficiency anemia, unspecified: Secondary | ICD-10-CM

## 2023-02-06 NOTE — Assessment & Plan Note (Addendum)
Vitals:   02/06/23 1550 02/06/23 1552  BP: (!) 150/85 (!) 153/101   Patient reports taking Olmesartan-hydrochlorothiazide 40-12.5 mg once daily Not taking Amlodipine 10 mg once daily as advised previously in our last visit Patient agreed to start taking amlodipine 10 mg once daily Labs ordered. Discussed with  patient to monitor their blood pressure regularly and maintain a heart-healthy diet rich in fruits, vegetables, whole grains, and low-fat dairy, while reducing sodium intake to less than 2,300 mg per day. Regular physical activity, such as 30 minutes of moderate exercise most days of the week, will help lower blood pressure and improve overall cardiovascular health. Avoiding smoking, limiting alcohol consumption, and managing stress. Take  prescribed medication, & take it as directed and avoid skipping doses. Seek emergency care if your blood pressure is (over 180/100) or you experience chest pain, shortness of breath, or sudden vision changes.Patient verbalizes understanding regarding plan of care and all questions answered. Follow up in 2 weeks with at home blood pressure to monitor trends.

## 2023-02-06 NOTE — Progress Notes (Signed)
Patient Office Visit   Subjective   Patient ID: Bruce Koch, male    DOB: [birth year]  Age: 52 y.o. MRN: [id number]  CC:  Chief Complaint  Patient presents with   Follow-up    Hypertension, hyperlipidemia, wants to discuss Testosterone levels.     HPI Bruce Koch 52 year old male, presents to the clinic for Hypertension management. He  has a past medical history of Depression (12/21/2013), Hypertension, and Toe fracture.For the details of today's visit, please refer to assessment and plan.   HPI   Outpatient Encounter Medications as of 02/06/2023  Medication Sig   amLODipine (NORVASC) 10 MG tablet TAKE 1 TABLET(10 MG) BY MOUTH DAILY   Blood Pressure Monitoring (BLOOD PRESSURE KIT) DEVI 1 kit by Does not apply route once as needed for up to 1 dose. (Patient not taking: Reported on 02/06/2023)   cyclobenzaprine (FLEXERIL) 10 MG tablet Take 1 tablet (10 mg total) by mouth 2 (two) times daily as needed for muscle spasms.   olmesartan-hydrochlorothiazide (BENICAR HCT) 40-12.5 MG tablet TAKE 1 TABLET BY MOUTH DAILY   rosuvastatin (CRESTOR) 20 MG tablet Take 1 tablet (20 mg total) by mouth daily.   tamsulosin (FLOMAX) 0.4 MG CAPS capsule Take 0.4 mg by mouth daily.   [DISCONTINUED] amLODipine (NORVASC) 5 MG tablet Take 1 tablet (5 mg total) by mouth daily.   No facility-administered encounter medications on file as of 02/06/2023.    History reviewed. No pertinent surgical history.  Review of Systems  Constitutional:  Negative for chills and fever.  Eyes:  Negative for blurred vision.  Respiratory:  Negative for shortness of breath.   Cardiovascular:  Negative for chest pain.  Neurological:  Negative for dizziness and headaches.      Objective    BP (!) 153/101   Pulse 77   Wt (!) 360 lb 13.6 oz (163.7 kg)   SpO2 94%   BMI 47.61 kg/m   Physical Exam Vitals reviewed.  Constitutional:      General: He is not in acute distress.    Appearance: Normal appearance.  He is not ill-appearing, toxic-appearing or diaphoretic.  HENT:     Head: Normocephalic.  Eyes:     General:        Right eye: No discharge.        Left eye: No discharge.     Conjunctiva/sclera: Conjunctivae normal.  Cardiovascular:     Rate and Rhythm: Normal rate.     Pulses: Normal pulses.     Heart sounds: Normal heart sounds.  Pulmonary:     Effort: Pulmonary effort is normal. No respiratory distress.     Breath sounds: Normal breath sounds.  Musculoskeletal:     Cervical back: Normal range of motion.  Skin:    General: Skin is warm and dry.     Capillary Refill: Capillary refill takes less than 2 seconds.  Neurological:     Mental Status: He is alert.  Psychiatric:        Mood and Affect: Mood normal.       Assessment & Plan:  Primary hypertension Assessment & Plan: Vitals:   02/06/23 1550 02/06/23 1552  BP: (!) 150/85 (!) 153/101   Patient reports taking Olmesartan-hydrochlorothiazide 40-12.5 mg once daily Not taking Amlodipine 10 mg once daily as advised previously in our last visit Patient agreed to start taking amlodipine 10 mg once daily Labs ordered. Discussed with  patient to monitor their blood pressure regularly and maintain a heart-healthy  diet rich in fruits, vegetables, whole grains, and low-fat dairy, while reducing sodium intake to less than 2,300 mg per day. Regular physical activity, such as 30 minutes of moderate exercise most days of the week, will help lower blood pressure and improve overall cardiovascular health. Avoiding smoking, limiting alcohol consumption, and managing stress. Take  prescribed medication, & take it as directed and avoid skipping doses. Seek emergency care if your blood pressure is (over 180/100) or you experience chest pain, shortness of breath, or sudden vision changes.Patient verbalizes understanding regarding plan of care and all questions answered. Follow up in 2 weeks with at home blood pressure to monitor trends.    Orders: -     BMP8+eGFR -     Lipid panel -     CBC with Differential/Platelet  Testosterone deficiency in male -     Testosterone  Iron deficiency anemia, unspecified iron deficiency anemia type -     Iron, TIBC and Ferritin Panel  Vitamin D deficiency -     VITAMIN D 25 Hydroxy (Vit-D Deficiency, Fractures)  Vitamin B12 deficiency -     Vitamin B12    Return in about 4 months (around 06/09/2023), or if symptoms worsen or fail to improve, for chronic follow-up.   Bruce Lederer Newman Nip, FNP

## 2023-02-06 NOTE — Patient Instructions (Signed)

## 2023-04-21 ENCOUNTER — Other Ambulatory Visit: Payer: Self-pay | Admitting: Family Medicine

## 2023-05-24 ENCOUNTER — Emergency Department (HOSPITAL_COMMUNITY): Payer: No Typology Code available for payment source

## 2023-05-24 ENCOUNTER — Other Ambulatory Visit: Payer: Self-pay

## 2023-05-24 ENCOUNTER — Encounter (HOSPITAL_COMMUNITY): Payer: Self-pay | Admitting: *Deleted

## 2023-05-24 ENCOUNTER — Emergency Department (HOSPITAL_COMMUNITY)
Admission: EM | Admit: 2023-05-24 | Discharge: 2023-05-24 | Discharge status: Against Medical Advice | Payer: No Typology Code available for payment source | Attending: Emergency Medicine | Admitting: Emergency Medicine

## 2023-05-24 DIAGNOSIS — Z716 Tobacco abuse counseling: Secondary | ICD-10-CM | POA: Insufficient documentation

## 2023-05-24 DIAGNOSIS — J101 Influenza due to other identified influenza virus with other respiratory manifestations: Secondary | ICD-10-CM | POA: Diagnosis not present

## 2023-05-24 DIAGNOSIS — F17200 Nicotine dependence, unspecified, uncomplicated: Secondary | ICD-10-CM | POA: Insufficient documentation

## 2023-05-24 DIAGNOSIS — I10 Essential (primary) hypertension: Secondary | ICD-10-CM | POA: Insufficient documentation

## 2023-05-24 DIAGNOSIS — R062 Wheezing: Secondary | ICD-10-CM

## 2023-05-24 DIAGNOSIS — Z5329 Procedure and treatment not carried out because of patient's decision for other reasons: Secondary | ICD-10-CM | POA: Insufficient documentation

## 2023-05-24 DIAGNOSIS — R059 Cough, unspecified: Secondary | ICD-10-CM | POA: Diagnosis present

## 2023-05-24 DIAGNOSIS — Z79899 Other long term (current) drug therapy: Secondary | ICD-10-CM | POA: Diagnosis not present

## 2023-05-24 DIAGNOSIS — Z20822 Contact with and (suspected) exposure to covid-19: Secondary | ICD-10-CM | POA: Insufficient documentation

## 2023-05-24 DIAGNOSIS — J111 Influenza due to unidentified influenza virus with other respiratory manifestations: Secondary | ICD-10-CM

## 2023-05-24 LAB — RESP PANEL BY RT-PCR (RSV, FLU A&B, COVID)  RVPGX2
Influenza A by PCR: POSITIVE — AB
Influenza B by PCR: NEGATIVE
Resp Syncytial Virus by PCR: NEGATIVE
SARS Coronavirus 2 by RT PCR: NEGATIVE

## 2023-05-24 MED ORDER — IPRATROPIUM-ALBUTEROL 0.5-2.5 (3) MG/3ML IN SOLN
3.0000 mL | Freq: Once | RESPIRATORY_TRACT | Status: AC
  Administered 2023-05-24: 3 mL via RESPIRATORY_TRACT
  Filled 2023-05-24: qty 3

## 2023-05-24 MED ORDER — IPRATROPIUM-ALBUTEROL 0.5-2.5 (3) MG/3ML IN SOLN: 3.0000 mL | Freq: Four times a day (QID) | RESPIRATORY_TRACT | 0 refills | Status: DC | PRN

## 2023-05-24 MED ORDER — ALBUTEROL SULFATE HFA 108 (90 BASE) MCG/ACT IN AERS
INHALATION_SPRAY | RESPIRATORY_TRACT | Status: AC
  Filled 2023-05-24: qty 6.7

## 2023-05-24 MED ORDER — OSELTAMIVIR PHOSPHATE 75 MG PO CAPS: 75.0000 mg | ORAL_CAPSULE | Freq: Two times a day (BID) | ORAL | 0 refills | Status: DC

## 2023-05-24 MED ORDER — ALBUTEROL SULFATE HFA 108 (90 BASE) MCG/ACT IN AERS: 1.0000 | INHALATION_SPRAY | Freq: Four times a day (QID) | RESPIRATORY_TRACT | 0 refills | Status: DC | PRN

## 2023-05-24 MED ORDER — ACETAMINOPHEN 500 MG PO TABS
1000.0000 mg | ORAL_TABLET | Freq: Once | ORAL | Status: AC
  Administered 2023-05-24: 1000 mg via ORAL
  Filled 2023-05-24: qty 2

## 2023-05-24 MED ORDER — ALBUTEROL SULFATE HFA 108 (90 BASE) MCG/ACT IN AERS: 2.0000 | INHALATION_SPRAY | RESPIRATORY_TRACT | Status: DC | PRN

## 2023-05-24 NOTE — Discharge Instructions (Addendum)
You are seen in the ER today for body aches, cough and chills.  You have influenza.  Your fever was controlled with Tylenol, your chest x-ray not show pneumonia other abnormal findings.  Your oxygen was low and we suggested you be admitted to the hospital to observe you and have you on supplemental oxygen, we discussed risks of worsening of your condition including respiratory failure and at worst possibly death or permanent disability.  If you are feeling worse or change your mind about admission please come back to the hospital.  Is very important that you stop smoking, he can cut down gradually, you can follow-up with your PCP who may be able to prescribe medications to help you stop.  You need to take Tylenol and ibuprofen as needed for pain and fever, drink plenty of fluids.  Avoid taking Tylenol in addition to combination medication such as DayQuil or TheraFlu as those also have Tylenol in them and put you at risk for overdose.

## 2023-05-24 NOTE — ED Provider Notes (Cosign Needed)
[place] EMERGENCY DEPARTMENT AT [facility] Provider Note   CSN: [id number] Arrival date & time: 05/24/23  1138     History  Chief Complaint  Patient presents with   Generalized Body Aches    Bruce Koch is a 53 y.o. male.  PMH significant for hypertension, obesity, cholesterol, tobacco abuse.  Is ER complaining of bodyaches, chills, cough that started yesterday while he was outside working.  Denies chest pain.  Admits to fever and chills.  Took DayQuil this morning when he woke up and then took ibuprofen PM later in the day, he had mild improvement but states he still overall feels very unwell  HPI     Home Medications Prior to Admission medications   Medication Sig Start Date End Date Taking? Authorizing Provider  albuterol (VENTOLIN HFA) 108 (90 Base) MCG/ACT inhaler Inhale 1-2 puffs into the lungs every 6 (six) hours as needed for wheezing or shortness of breath. 05/24/23  Yes Vasilisa Vore A, PA-C  albuterol (VENTOLIN HFA) 108 (90 Base) MCG/ACT inhaler Inhale 1-2 puffs into the lungs every 6 (six) hours as needed for wheezing or shortness of breath. 05/24/23  Yes Ladarren Steiner A, PA-C  ipratropium-albuterol (DUONEB) 0.5-2.5 (3) MG/3ML SOLN Take 3 mLs by nebulization every 6 (six) hours as needed. 05/24/23  Yes Lilas Diefendorf A, PA-C  oseltamivir (TAMIFLU) 75 MG capsule Take 1 capsule (75 mg total) by mouth every 12 (twelve) hours. 05/24/23  Yes Garey Alleva A, PA-C  amLODipine (NORVASC) 10 MG tablet TAKE 1 TABLET(10 MG) BY MOUTH DAILY 04/21/23   Del Newman Nip, Hartly, FNP  Blood Pressure Monitoring (BLOOD PRESSURE KIT) DEVI 1 kit by Does not apply route once as needed for up to 1 dose. Patient not taking: Reported on 02/06/2023 07/15/22   Del Nigel Berthold, FNP  cyclobenzaprine (FLEXERIL) 10 MG tablet Take 1 tablet (10 mg total) by mouth 2 (two) times daily as needed for muscle spasms. 05/24/21   Zadie Rhine, MD  olmesartan-hydrochlorothiazide (BENICAR  HCT) 40-12.5 MG tablet TAKE 1 TABLET BY MOUTH DAILY 01/16/23   Del Nigel Berthold, FNP  rosuvastatin (CRESTOR) 20 MG tablet Take 1 tablet (20 mg total) by mouth daily. 07/22/22   Del Nigel Berthold, FNP  tamsulosin (FLOMAX) 0.4 MG CAPS capsule Take 0.4 mg by mouth daily. 11/21/20   [provider]      Allergies    Patient has no known allergies.    Review of Systems   Review of Systems  Physical Exam Updated Vital Signs BP (!) 161/91   Pulse (!) 103   Temp 99.6 F (37.6 C) (Axillary)   Resp (!) 22   Ht 6\' 1"  (1.854 m)   Wt (!) 176.9 kg   SpO2 93%   BMI 51.45 kg/m  Physical Exam Vitals and nursing note reviewed.  Constitutional:      General: He is not in acute distress.    Appearance: He is well-developed. He is obese.  HENT:     Head: Normocephalic and atraumatic.     Mouth/Throat:     Mouth: Mucous membranes are moist.  Eyes:     Conjunctiva/sclera: Conjunctivae normal.     Pupils: Pupils are equal, round, and reactive to light.  Cardiovascular:     Rate and Rhythm: Normal rate and regular rhythm.     Heart sounds: No murmur heard. Pulmonary:     Effort: Pulmonary effort is normal. No respiratory distress.     Breath sounds:  Normal breath sounds.  Abdominal:     Palpations: Abdomen is soft.     Tenderness: There is no abdominal tenderness.  Musculoskeletal:        General: No swelling.     Cervical back: Neck supple.     Right lower leg: No edema.     Left lower leg: No edema.  Skin:    General: Skin is warm and dry.     Capillary Refill: Capillary refill takes less than 2 seconds.  Neurological:     General: No focal deficit present.     Mental Status: He is alert and oriented to person, place, and time.  Psychiatric:        Mood and Affect: Mood normal.     ED Results / Procedures / Treatments   Labs (all labs ordered are listed, but only abnormal results are displayed) Labs Reviewed  RESP PANEL BY RT-PCR (RSV, FLU A&B, COVID)   RVPGX2 - Abnormal; Notable for the following components:      Result Value   Influenza A by PCR POSITIVE (*)    All other components within normal limits    EKG None  Radiology DG Chest 2 View Result Date: 05/24/2023 CLINICAL DATA:  Shortness of breath, cough, flu. EXAM: CHEST - 2 VIEW COMPARISON:  04/24/2015. FINDINGS: Trachea is midline. Heart size is accentuated by technique. Minimal streaky densities at the left lung base. No airspace consolidation or pleural fluid. Image quality on the lateral view is degraded by motion. IMPRESSION: Minimal streaky atelectasis or scarring in the left lung base. Electronically Signed   By: Leanna Battles M.D.   On: 05/24/2023 14:17    Procedures Procedures    Medications Ordered in ED Medications  albuterol (VENTOLIN HFA) 108 (90 Base) MCG/ACT inhaler (has no administration in time range)  acetaminophen (TYLENOL) tablet 1,000 mg (1,000 mg Oral Given 05/24/23 1349)  ipratropium-albuterol (DUONEB) 0.5-2.5 (3) MG/3ML nebulizer solution 3 mL (3 mLs Nebulization Given 05/24/23 1514)    ED Course/ Medical Decision Making/ A&P                                 Medical Decision Making This patient presents to the ED for concern of cough, fever, body aches, this involves an extensive number of treatment options, and is a complaint that carries with it a high risk of complications and morbidity.  The differential diagnosis includes influenza, COVID-19, pneumonia, bronchitis, COPD exacerbation, other   Co morbidities that complicate the patient evaluation :   Obesity, tobacco abuse   Additional history obtained:  Additional history obtained from EMR External records from outside source obtained and reviewed including prior notes   Lab Tests:  I Ordered, and personally interpreted labs.  The pertinent results include: Positive for influenza A   Imaging Studies ordered:  I ordered imaging studies including x-ray which shows no pulmonary edema or  infiltrate I independently visualized and interpreted imaging within scope of identifying emergent findings  I agree with the radiologist interpretation    Problem List / ED Course / Critical interventions / Medication management  Influenza A-patient having slightly low oxygen.  Initially saturating 93 to 94% on room air but patient states that he was having more trouble when he was exerting himself so ambulatory Tory pulse ox was performed and patient initially only went down to 90 that when he ambulated the bathroom and came back with his down to  88 and did not recover with put on 2 L of oxygen.  Discussed with patient indication for admission for his hypoxia due to influenza.  We discussed that likely being contributed to by smoking and obesity as well.  Patient had started wheezing after ambulating as well so was given a DuoNeb.  He is refusing admission at this time.  Discussed plans for labs and hospitalist consult for admission.  He denies any specific barriers but does not want to stay and thinks he will be okay going home.  He states he has had the flu before, wants to go home and take care of his children and is feeling better after the breathing treatment.  We discussed risk of worsening symptoms up to and including respiratory failure, death and he understands and is able to come back if his symptoms start worsening.  Will send him home with bronchodilators as well since this seemed to help him.  Advised on close follow-up and her precautions.  Will give him Tamiflu as well as he is within 24 hours of symptom onset. I ordered medication including DuoNeb for easing and shortness of breath Reevaluation of the patient after these medicines showed that the patient resolved I have reviewed the patients home medicines and have made adjustments as needed  Patient is ANO x 4, demonstrates decision-making capacity, understands risk of leaving. Social Determinants of Health:  Patient smokes  cigarettes    Amount and/or Complexity of Data Reviewed Radiology: ordered.  Risk OTC drugs. Prescription drug management.           Final Clinical Impression(s) / ED Diagnoses Final diagnoses:  Influenza  Tobacco abuse counseling  Wheezing    Rx / DC Orders ED Discharge Orders          Ordered    oseltamivir (TAMIFLU) 75 MG capsule  Every 12 hours        05/24/23 1423    albuterol (VENTOLIN HFA) 108 (90 Base) MCG/ACT inhaler  Every 6 hours PRN        05/24/23 1445    ipratropium-albuterol (DUONEB) 0.5-2.5 (3) MG/3ML SOLN  Every 6 hours PRN        05/24/23 1525    albuterol (VENTOLIN HFA) 108 (90 Base) MCG/ACT inhaler  Every 6 hours PRN        05/24/23 [id number] A, PA-C 05/24/23 1534

## 2023-05-24 NOTE — ED Triage Notes (Signed)
Pt with cough, HA, fevers and body aches since yesterday. Pt states his kids have been sick for past month.

## 2023-05-24 NOTE — ED Notes (Signed)
2L of oxygen has been applied. Pt's oxygen unable to increase after walking to bathroom.

## 2023-05-24 NOTE — ED Notes (Addendum)
Pt ambulated with pulse oximetry. Reading stayed between 90% and 93%. When pt returned from restroom oxygen went to 88%.

## 2023-07-08 ENCOUNTER — Other Ambulatory Visit: Payer: Self-pay | Admitting: Internal Medicine

## 2023-07-08 DIAGNOSIS — I10 Essential (primary) hypertension: Secondary | ICD-10-CM

## 2023-07-18 ENCOUNTER — Other Ambulatory Visit: Payer: Self-pay | Admitting: Family Medicine

## 2023-10-06 ENCOUNTER — Ambulatory Visit: Payer: Self-pay

## 2023-10-06 NOTE — Telephone Encounter (Signed)
 FYI Only or Action Required?: FYI only for provider  Patient was last seen in primary care on 02/06/2023 by Rosanna Comment, FNP. Called Nurse Triage reporting Hypertension. Symptoms began several days ago. Interventions attempted: Prescription medications: Amlodipine . Symptoms are: unchanged.  Triage Disposition: See PCP Within 2 Weeks  Patient/caregiver understands and will follow disposition?: Yes          Copied from CRM [id number]. Topic: Clinical - Red Word Triage >> Oct 06, 2023 12:54 PM Chrystal Crape R wrote: High blood pressure  Pt calling to resch a high blood pressure acute appointment for a week out support chat recommend that I transfer triage Reason for Disposition  [1] Systolic BP  >= 130 OR Diastolic >= 80 AND [2] taking BP medications  Answer Assessment - Initial Assessment Questions 1. BLOOD PRESSURE: What is the blood pressure? Did you take at least two measurements 5 minutes apart?     Last reading was 158/94 2. ONSET: When did you take your blood pressure?     Two days ago. 3. HOW: How did you take your blood pressure? 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       4. HISTORY: Do you have a history of high blood pressure?     Yes 5. MEDICINES: Are you taking any medicines for blood pressure? Have you missed any doses recently?     Yes, no recent missed doses.  6. OTHER SYMPTOMS: Do you have any symptoms? (e.g., blurred vision, chest pain, difficulty breathing, headache, weakness)     Mild weakness.    Pt. 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  Pt. Is asymptomatic at this time, but mentions per his DOT physical his reading was high at 158/94, and he needs to follow up with PCP.  Protocols used: Blood Pressure - High-A-AH

## 2023-10-06 NOTE — Telephone Encounter (Signed)
 FYI Only or Action Required?: FYI only for provider  Patient was last seen in primary care on 02/06/2023 by Rosanna Comment, FNP. Called Nurse Triage reporting Hypertension. Symptoms began several days ago. Interventions attempted: Nothing. Symptoms are: stable.  Triage Disposition: See PCP When Office is Open (Within 3 Days)  Patient/caregiver understands and will follow disposition?: Yes  Copied from CRM [id number]. Topic: Clinical - Red Word Triage >> Oct 06, 2023 12:05 PM Fonda T wrote: Red Word that prompted transfer to Nurse Triage: High blood pressure, per patient from DOT physical, informed patient he needed to be evaluated as soon as possible. Previous readings for blood pressure on 10/01/23 per DOT physical 163/95, 158/94. Reason for Disposition  Systolic BP  >= 160 OR Diastolic >= 100  Answer Assessment - Initial Assessment Questions 1. BLOOD PRESSURE: What is the blood pressure? Did you take at least two measurements 5 minutes apart?     163/95, 158/94 2. ONSET: When did you take your blood pressure?     10/01/2023 at patient's DOT physical 3. HOW: How did you take your blood pressure? (e.g., automatic home BP monitor, visiting nurse)     Automatic BP monitor and manual cuff reading 4. HISTORY: Do you have a history of high blood pressure?     yes 5. MEDICINES: Are you taking any medicines for blood pressure? Have you missed any doses recently?     amlodipine  6. OTHER SYMPTOMS: Do you have any symptoms? (e.g., blurred vision, chest pain, difficulty breathing, headache, weakness)     Patient reports weakness.  Protocols used: Blood Pressure - High-A-AH

## 2023-10-08 ENCOUNTER — Ambulatory Visit: Payer: Self-pay | Admitting: Family Medicine

## 2023-10-08 ENCOUNTER — Ambulatory Visit: Payer: Self-pay

## 2023-10-08 NOTE — Telephone Encounter (Signed)
 FYI Only or Action Required?: Action required by provider  Patient was last seen in primary care on 02/06/2023 by Del Abron Abt, FNP. Called Nurse Triage reporting Hypertension. Symptoms began several weeks ago. Interventions attempted: Nothing. Symptoms are: unchanged.  Triage Disposition: See PCP Within 2 Weeks  Patient/caregiver understands and will follow disposition?: Yes                       Copied from CRM [id number]. Topic: Clinical - Red Word Triage >> Oct 08, 2023 11:56 AM Ivette P wrote: [facility] that prompted transfer to Nurse Triage: pt calling in to reschedule appt for today, pt initially reschedule and   158/94 - last reading    ----------------------------------------------------------------------- From previous Reason for Contact - Cancel/Reschedule: Patient/patient representative is calling to cancel or reschedule an appointment. Refer to attachments for appointment information. Reason for Disposition  [1] Systolic BP  >= 130 OR Diastolic >= 80 AND [2] taking BP medications  Answer Assessment - Initial Assessment Questions Patient wants to know if he can be squeezed in for an appointment today as he is off work. Patient currently has an appointment scheduled for Mon, 6/23. Patient call back number is [phone].    BLOOD PRESSURE: What is the blood pressure? Did you take at least two measurements 5 minutes apart?     163/95 the first time and 158/94 second time taken on 6/11. Patient states he is not able to check his BP at home  MEDICINES: Are you taking any medicines for blood pressure? Have you missed any doses recently?     Yes, did not miss dose  OTHER SYMPTOMS: Do you have any symptoms? (e.g., blurred vision, chest pain, difficulty breathing, headache, weakness)     A little bit of difficulty breathing started yesterday during activity one episode, slight pressure in head yesterday  Denies chest pain  Protocols  used: Blood Pressure - High-A-AH

## 2023-10-08 NOTE — Telephone Encounter (Signed)
Patient scheduled 6/23

## 2023-10-13 ENCOUNTER — Telehealth

## 2023-10-13 ENCOUNTER — Ambulatory Visit: Payer: Self-pay | Admitting: Family Medicine

## 2023-10-15 ENCOUNTER — Ambulatory Visit (INDEPENDENT_AMBULATORY_CARE_PROVIDER_SITE_OTHER): Admitting: Family Medicine

## 2023-10-15 ENCOUNTER — Encounter: Payer: Self-pay | Admitting: Family Medicine

## 2023-10-15 VITALS — BP 164/94 | HR 71 | Resp 16 | Ht 73.0 in | Wt 358.0 lb

## 2023-10-15 DIAGNOSIS — E291 Testicular hypofunction: Secondary | ICD-10-CM

## 2023-10-15 DIAGNOSIS — I10 Essential (primary) hypertension: Secondary | ICD-10-CM

## 2023-10-15 DIAGNOSIS — E559 Vitamin D deficiency, unspecified: Secondary | ICD-10-CM

## 2023-10-15 DIAGNOSIS — E038 Other specified hypothyroidism: Secondary | ICD-10-CM | POA: Diagnosis not present

## 2023-10-15 DIAGNOSIS — R7301 Impaired fasting glucose: Secondary | ICD-10-CM

## 2023-10-15 DIAGNOSIS — G4730 Sleep apnea, unspecified: Secondary | ICD-10-CM

## 2023-10-15 MED ORDER — VALSARTAN 80 MG PO TABS: 80.0000 mg | ORAL_TABLET | Freq: Every day | ORAL | 3 refills | Status: DC

## 2023-10-15 NOTE — Assessment & Plan Note (Addendum)
 Vitals:   10/15/23 1012 10/15/23 1049  BP: (!) 161/101 (!) 164/94  Not controlled in today's visit Discontinue Olmesartan -hydrochlorothiazide 40-12 mg Start Valsartan 80 mg once daily, continue amlodipine  10 once daily Follow up in 4 weeks. Labs ordered. Discussed with  patient to monitor their blood pressure regularly and maintain a heart-healthy diet rich in fruits, vegetables, whole grains, and low-fat dairy, while reducing sodium intake to less than 2,300 mg per day. Regular physical activity, such as 30 minutes of moderate exercise most days of the week, will help lower blood pressure and improve overall cardiovascular health. Avoiding smoking, limiting alcohol consumption, and managing stress. Take  prescribed medication, & take it as directed and avoid skipping doses. Seek emergency care if your blood pressure is (over 180/100) or you experience chest pain, shortness of breath, or sudden vision changes.Patient verbalizes understanding regarding plan of care and all questions answered.

## 2023-10-15 NOTE — Progress Notes (Signed)
 Established Patient Office Visit   Subjective  Patient ID: Bruce Koch, male    DOB: October 28, 1970  Age: 53 y.o. MRN: [id number]  Chief Complaint  Patient presents with   Hypertension    Elevated BP     He  has a past medical history of Depression (12/21/2013), Hyperlipidemia, Hypertension, and Toe fracture.  HPI  Review of Systems  Constitutional:  Negative for chills and fever.  Respiratory:  Negative for shortness of breath.   Cardiovascular:  Negative for chest pain and palpitations.  Gastrointestinal:  Negative for abdominal pain.  Genitourinary:  Negative for dysuria.  Neurological:  Positive for headaches. Negative for dizziness.      Objective:     BP (!) 164/94   Pulse 71   Resp 16   Ht 6' 1 (1.854 m)   Wt (!) 358 lb (162.4 kg)   SpO2 97%   BMI 47.23 kg/m  BP Readings from Last 3 Encounters:  10/15/23 (!) 164/94  05/24/23 124/84  02/06/23 (!) 153/101      Physical Exam Vitals reviewed.  Constitutional:      General: He is not in acute distress.    Appearance: Normal appearance. He is obese. He is not ill-appearing, toxic-appearing or diaphoretic.  HENT:     Head: Normocephalic.   Eyes:     General:        Right eye: No discharge.        Left eye: No discharge.     Conjunctiva/sclera: Conjunctivae normal.    Cardiovascular:     Rate and Rhythm: Normal rate.     Pulses: Normal pulses.     Heart sounds: Normal heart sounds.  Pulmonary:     Effort: Pulmonary effort is normal. No respiratory distress.     Breath sounds: Normal breath sounds.  Abdominal:     General: Bowel sounds are normal.   Musculoskeletal:     Cervical back: Normal range of motion.   Skin:    General: Skin is warm and dry.     Capillary Refill: Capillary refill takes less than 2 seconds.   Neurological:     Mental Status: He is alert.   Psychiatric:        Mood and Affect: Mood normal.        Behavior: Behavior normal.      No results found for any  visits on 10/15/23.  The 10-year ASCVD risk score (Arnett DK, et al., 2019) is: 25.9%    Assessment & Plan:  Uncontrolled hypertension -     Ambulatory referral to Cardiology -     CMP14+EGFR -     CBC with Differential/Platelet -     Lipid panel  TSH (thyroid-stimulating hormone deficiency) -     TSH + free T4  IFG (impaired fasting glucose) -     Hemoglobin A1c  Vitamin D deficiency -     VITAMIN D 25 Hydroxy (Vit-D Deficiency, Fractures)  Testosterone  deficiency in male -     Testosterone ,Free and Total  Sleep apnea, unspecified type Assessment & Plan: Stop bang score 7 points - high risk  Referral placed to sleep study  Orders: -     Ambulatory referral to Sleep Studies  Primary hypertension Assessment & Plan: Vitals:   10/15/23 1012 10/15/23 1049  BP: (!) 161/101 (!) 164/94  Not controlled in today's visit Discontinue Olmesartan -hydrochlorothiazide 40-12 mg Start Valsartan 80 mg once daily, continue amlodipine  10 once daily Follow up in 4 weeks. Labs  ordered. Discussed with  patient to monitor their blood pressure regularly and maintain a heart-healthy diet rich in fruits, vegetables, whole grains, and low-fat dairy, while reducing sodium intake to less than 2,300 mg per day. Regular physical activity, such as 30 minutes of moderate exercise most days of the week, will help lower blood pressure and improve overall cardiovascular health. Avoiding smoking, limiting alcohol consumption, and managing stress. Take  prescribed medication, & take it as directed and avoid skipping doses. Seek emergency care if your blood pressure is (over 180/100) or you experience chest pain, shortness of breath, or sudden vision changes.Patient verbalizes understanding regarding plan of care and all questions answered.    Other orders -     Valsartan; Take 1 tablet (80 mg total) by mouth daily.  Dispense: 30 tablet; Refill: 3    Return in about 4 weeks (around 11/12/2023), or if  symptoms worsen or fail to improve, for hypertension.   Hilario Kidd Wilhelmena Falter, FNP

## 2023-10-15 NOTE — Assessment & Plan Note (Signed)
 Stop bang score 7 points - high risk  Referral placed to sleep study

## 2023-10-15 NOTE — Patient Instructions (Addendum)
        Great to see you today.  I have refilled the medication(s) we provide.   Stop taking Olmesatan-hydrochlorothiazide 40-12mg   Start Valsartan 80 mg once daily  Continue Amlodipine  10 mg once daily  Follow up in 4 weeks to recheck blood pressure  Referral placed to cardiology   If labs were collected, we will inform you of lab results once received either by echart message or telephone call.   - echart message- for normal results that have been seen by the patient already.   - telephone call: abnormal results or if patient has not viewed results in their echart.   - Please take medications as prescribed. - Follow up with your primary health provider if any health concerns arises. - If symptoms worsen please contact your primary care provider and/or visit the emergency department.

## 2023-10-16 ENCOUNTER — Other Ambulatory Visit: Payer: Self-pay | Admitting: Family Medicine

## 2023-10-16 ENCOUNTER — Ambulatory Visit: Payer: Self-pay | Admitting: Family Medicine

## 2023-10-16 LAB — CMP14+EGFR
ALT: 63 IU/L — ABNORMAL HIGH (ref 0–44)
AST: 46 IU/L — ABNORMAL HIGH (ref 0–40)
Albumin: 4.2 g/dL (ref 3.8–4.9)
Alkaline Phosphatase: 76 IU/L (ref 44–121)
BUN/Creatinine Ratio: 20 (ref 9–20)
BUN: 17 mg/dL (ref 6–24)
Bilirubin Total: <0.2 mg/dL (ref 0.0–1.2)
CO2: 19 mmol/L — ABNORMAL LOW (ref 20–29)
Calcium: 9.3 mg/dL (ref 8.7–10.2)
Chloride: 102 mmol/L (ref 96–106)
Creatinine, Ser: 0.84 mg/dL (ref 0.76–1.27)
Globulin, Total: 2.5 g/dL (ref 1.5–4.5)
Glucose: 98 mg/dL (ref 70–99)
Potassium: 4.2 mmol/L (ref 3.5–5.2)
Sodium: 138 mmol/L (ref 134–144)
Total Protein: 6.7 g/dL (ref 6.0–8.5)
eGFR: 105 mL/min/{1.73_m2} (ref >59)

## 2023-10-16 LAB — CBC WITH DIFFERENTIAL/PLATELET
Basophils Absolute: 0.1 10*3/uL (ref 0.0–0.2)
Basos: 1 %
EOS (ABSOLUTE): 0.2 10*3/uL (ref 0.0–0.4)
Eos: 4 %
Hematocrit: 55.8 % — ABNORMAL HIGH (ref 37.5–51.0)
Hemoglobin: 17.8 g/dL — ABNORMAL HIGH (ref 13.0–17.7)
Immature Grans (Abs): 0 10*3/uL (ref 0.0–0.1)
Immature Granulocytes: 0 %
Lymphocytes Absolute: 1.6 10*3/uL (ref 0.7–3.1)
Lymphs: 34 %
MCH: 28.3 pg (ref 26.6–33.0)
MCHC: 31.9 g/dL (ref 31.5–35.7)
MCV: 89 fL (ref 79–97)
Monocytes Absolute: 0.4 10*3/uL (ref 0.1–0.9)
Monocytes: 9 %
Neutrophils Absolute: 2.4 10*3/uL (ref 1.4–7.0)
Neutrophils: 52 %
Platelets: 219 10*3/uL (ref 150–450)
RBC: 6.28 x10E6/uL — ABNORMAL HIGH (ref 4.14–5.80)
RDW: 13.5 % (ref 11.6–15.4)
WBC: 4.6 10*3/uL (ref 3.4–10.8)

## 2023-10-16 LAB — HEMOGLOBIN A1C
Est. average glucose Bld gHb Est-mCnc: 126 mg/dL
Hgb A1c MFr Bld: 6 % — ABNORMAL HIGH (ref 4.8–5.6)

## 2023-10-16 LAB — TSH+FREE T4
Free T4: 1.11 ng/dL (ref 0.82–1.77)
TSH: 1.58 u[IU]/mL (ref 0.450–4.500)

## 2023-10-16 LAB — LIPID PANEL
Chol/HDL Ratio: 2.8 ratio (ref 0.0–5.0)
Cholesterol, Total: 144 mg/dL (ref 100–199)
HDL: 51 mg/dL (ref >39)
LDL Chol Calc (NIH): 78 mg/dL (ref 0–99)
Triglycerides: 80 mg/dL (ref 0–149)
VLDL Cholesterol Cal: 15 mg/dL (ref 5–40)

## 2023-10-16 MED ORDER — METFORMIN HCL 500 MG PO TABS: 500.0000 mg | ORAL_TABLET | Freq: Every day | ORAL | 1 refills | Status: DC

## 2023-10-18 LAB — TESTOSTERONE,FREE AND TOTAL
Testosterone, Free: 4 pg/mL — AB (ref 7.2–24.0)
Testosterone: 294 ng/dL (ref 264–916)

## 2023-10-18 LAB — VITAMIN D 25 HYDROXY (VIT D DEFICIENCY, FRACTURES): Vit D, 25-Hydroxy: 39.2 ng/mL (ref 30.0–100.0)

## 2023-10-20 ENCOUNTER — Other Ambulatory Visit: Payer: Self-pay | Admitting: Internal Medicine

## 2023-10-20 ENCOUNTER — Ambulatory Visit (INDEPENDENT_AMBULATORY_CARE_PROVIDER_SITE_OTHER): Admitting: Internal Medicine

## 2023-10-20 ENCOUNTER — Encounter: Payer: Self-pay | Admitting: Internal Medicine

## 2023-10-20 ENCOUNTER — Ambulatory Visit: Payer: Self-pay

## 2023-10-20 VITALS — BP 146/90 | HR 69 | Ht 73.0 in | Wt 358.8 lb

## 2023-10-20 DIAGNOSIS — E782 Mixed hyperlipidemia: Secondary | ICD-10-CM

## 2023-10-20 DIAGNOSIS — I10 Essential (primary) hypertension: Secondary | ICD-10-CM

## 2023-10-20 DIAGNOSIS — D751 Secondary polycythemia: Secondary | ICD-10-CM | POA: Diagnosis not present

## 2023-10-20 DIAGNOSIS — R7303 Prediabetes: Secondary | ICD-10-CM

## 2023-10-20 MED ORDER — VALSARTAN 160 MG PO TABS: 160.0000 mg | ORAL_TABLET | Freq: Every day | ORAL | 1 refills | Status: DC

## 2023-10-20 NOTE — Telephone Encounter (Signed)
 Patient scheduled.

## 2023-10-20 NOTE — Telephone Encounter (Signed)
 FYI Only or Action Required?: FYI only for provider.  Patient was last seen in primary care on 10/15/2023 by Terry Wilhelmena Lloyd Hilario, FNP. Called Nurse Triage reporting Hypertension. Symptoms began several days ago. Interventions attempted: Nothing. Symptoms are: - High Bp readings unchanged.  Triage Disposition: See PCP Within 2 Weeks  Patient/caregiver understands and will follow disposition?: Yes                      Copied from CRM [id number]. Topic: Clinical - Red Word Triage >> Oct 20, 2023 11:29 AM Tiffini S wrote: [facility] that prompted transfer to Nurse Triage: Patient called about blood pressure being very high- said adjustment was made a few weeks ago. Blood pressure readings are changing are different reading ranging from 137/94 and  156/97. Reason for Disposition  [1] Systolic BP  >= 130 OR Diastolic >= 80 AND [2] taking BP medications  Answer Assessment - Initial Assessment Questions 1. BLOOD PRESSURE: What is the blood pressure? Did you take at least two measurements 5 minutes apart?     137/94, 156-97 2. ONSET: When did you take your blood pressure?     This morning -  3. HOW: How did you take your blood pressure? (e.g., automatic home BP monitor, visiting nurse)     automatic 4. HISTORY: Do you have a history of high blood pressure?     yes 5. MEDICINES: Are you taking any medicines for blood pressure? Have you missed any doses recently?     yes 6. OTHER SYMPTOMS: Do you have any symptoms? (e.g., blurred vision, chest pain, difficulty breathing, headache, weakness)     no  Protocols used: Blood Pressure - High-A-AH

## 2023-10-20 NOTE — Patient Instructions (Signed)
 Please start taking Valsartan  160 mg instead of 80 mg (okay to take 2 tablets of Valsatan 80 mg until you complete the supplies).  Please continue taking Amlodipine  10 mg once daily.  Please follow low salt diet and perform moderate exercise/walking at least 150 mins/week.

## 2023-10-20 NOTE — Progress Notes (Unsigned)
 Acute Office Visit  Subjective:    Patient ID: Bruce Koch, male    DOB: 1971/03/01, 53 y.o.   MRN: [id number]  Chief Complaint  Patient presents with   Hypertension    HPI Patient is in today for evaluation of hypertension.  He was recently placed on valsartan  80 mg QD instead of olmesartan -HCTZ 40-12.5 mg once daily by his PCP due to elevated BP in the last office visit about a week ago.  He is still taking amlodipine  10 mg QD.  He has noted BP around 150s/90s at home.  He currently denies headache, dizziness, chest pain or palpitations.  Past Medical History:  Diagnosis Date   Depression 12/21/2013   Hyperlipidemia    Hypertension    Toe fracture     History reviewed. No pertinent surgical history.  Family History  Problem Relation Age of Onset   Diabetes Father    Drug abuse Father        cocaine overdose    Alcohol abuse Mother        history    Alcohol abuse Sister    Alcohol abuse Brother     Social History   Socioeconomic History   Marital status: Married    Spouse name: Not on file   Number of children: 2   Years of education: 12    Highest education level: Not on file  Occupational History   Occupation: Unemployed   Tobacco Use   Smoking status: Every Day    Types: Cigars   Smokeless tobacco: Never   Tobacco comments:    He smokes Black and Milds Daily  Vaping Use   Vaping status: Never Used  Substance and Sexual Activity   Alcohol use: Not Currently    Comment: rarely    Drug use: Not Currently    Types: Marijuana, Cocaine   Sexual activity: Yes  Other Topics Concern   Not on file  Social History Narrative   Lives with wife (married since 2014) and 2 children age 77 and 59.    Social Drivers of Corporate investment banker Strain: Not on file  Food Insecurity: Not on file  Transportation Needs: Not on file  Physical Activity: Not on file  Stress: Not on file  Social Connections: Not on file  Intimate Partner Violence: Not on file     Outpatient Medications Prior to Visit  Medication Sig Dispense Refill   albuterol  (VENTOLIN  HFA) 108 (90 Base) MCG/ACT inhaler Inhale 1-2 puffs into the lungs every 6 (six) hours as needed for wheezing or shortness of breath. 18 g 0   albuterol  (VENTOLIN  HFA) 108 (90 Base) MCG/ACT inhaler Inhale 1-2 puffs into the lungs every 6 (six) hours as needed for wheezing or shortness of breath. 6.7 each 0   amLODipine  (NORVASC ) 10 MG tablet TAKE 1 TABLET(10 MG) BY MOUTH DAILY 90 tablet 0   cyclobenzaprine  (FLEXERIL ) 10 MG tablet Take 1 tablet (10 mg total) by mouth 2 (two) times daily as needed for muscle spasms. 10 tablet 0   metFORMIN  (GLUCOPHAGE ) 500 MG tablet Take 1 tablet (500 mg total) by mouth daily with breakfast. 90 tablet 1   rosuvastatin  (CRESTOR ) 20 MG tablet TAKE 1 TABLET(20 MG) BY MOUTH DAILY 90 tablet 3   tamsulosin (FLOMAX) 0.4 MG CAPS capsule Take 0.4 mg by mouth daily.     valsartan  (DIOVAN ) 80 MG tablet Take 1 tablet (80 mg total) by mouth daily. 30 tablet 3   No facility-administered medications  prior to visit.    No Known Allergies  Review of Systems  Constitutional:  Negative for chills and fever.  HENT:  Negative for congestion and sore throat.   Eyes:  Negative for pain and discharge.  Respiratory:  Negative for cough and shortness of breath.   Cardiovascular:  Negative for chest pain and palpitations.  Gastrointestinal:  Negative for diarrhea, nausea and vomiting.  Endocrine: Negative for polydipsia and polyuria.  Genitourinary:  Negative for dysuria and hematuria.  Musculoskeletal:  Negative for neck pain and neck stiffness.  Skin:  Negative for rash.  Neurological:  Negative for dizziness and weakness.  Psychiatric/Behavioral:  Negative for agitation and behavioral problems.        Objective:    Physical Exam Vitals reviewed.  Constitutional:      General: He is not in acute distress.    Appearance: He is not diaphoretic.  HENT:     Head: Normocephalic  and atraumatic.     Nose: Nose normal.     Mouth/Throat:     Mouth: Mucous membranes are moist.  Eyes:     General: No scleral icterus.    Extraocular Movements: Extraocular movements intact.  Cardiovascular:     Rate and Rhythm: Normal rate and regular rhythm.     Heart sounds: Normal heart sounds. No murmur heard. Pulmonary:     Breath sounds: Normal breath sounds. No wheezing or rales.  Musculoskeletal:     Cervical back: Neck supple. No tenderness.     Right lower leg: No edema.     Left lower leg: No edema.  Skin:    General: Skin is warm.     Findings: No rash.  Neurological:     General: No focal deficit present.     Mental Status: He is alert and oriented to person, place, and time.  Psychiatric:        Mood and Affect: Mood normal.        Behavior: Behavior normal.     BP (!) 146/90 (BP Location: Left Arm)   Pulse 69   Ht 6' 1 (1.854 m)   Wt (!) 358 lb 12.8 oz (162.8 kg)   SpO2 94%   BMI 47.34 kg/m  Wt Readings from Last 3 Encounters:  10/20/23 (!) 358 lb 12.8 oz (162.8 kg)  10/15/23 (!) 358 lb (162.4 kg)  05/24/23 (!) 390 lb (176.9 kg)        Assessment & Plan:   Problem List Items Addressed This Visit       Cardiovascular and Mediastinum   Primary hypertension - Primary   BP Readings from Last 1 Encounters:  10/20/23 (!) 146/90   Uncontrolled with valsartan  80 mg QD and amlodipine  10 mg QD Increased dose of Valsartan  to 160 mg once daily Advised to continue amlodipine  10 mg QD Counseled for compliance with the medications Advised DASH diet and moderate exercise/walking, at least 150 mins/week       Relevant Medications   valsartan  (DIOVAN ) 160 MG tablet     Other   Prediabetes   Lab Results  Component Value Date   HGBA1C 6.0 (H) 10/15/2023   Advised to follow DASH diet      Mixed hyperlipidemia   On Crestor  20 mg QD Advised to follow DASH diet      Relevant Medications   valsartan  (DIOVAN ) 160 MG tablet   Erythrocytosis    Likely due to OSA Needs to focus on diet modification in an effort to lose weight  Would need CBC monitoring        Meds ordered this encounter  Medications   valsartan  (DIOVAN ) 160 MG tablet    Sig: Take 1 tablet (160 mg total) by mouth daily.    Dispense:  30 tablet    Refill:  1    Dose change - 10/20/23     Suzzane MARLA Blanch, MD

## 2023-10-21 ENCOUNTER — Other Ambulatory Visit: Payer: Self-pay | Admitting: Family Medicine

## 2023-10-21 DIAGNOSIS — E291 Testicular hypofunction: Secondary | ICD-10-CM

## 2023-10-21 NOTE — Progress Notes (Signed)
 Please inform patient- Testosterone  levels low- referral placed to endocrinology for further evaluation

## 2023-10-22 ENCOUNTER — Telehealth: Payer: Self-pay

## 2023-10-22 NOTE — Telephone Encounter (Signed)
 Called pt to advise him of his lab results Per Hilario his Testosterone  levels low- and a  referral has been placed to endocrinology for further evaluation they should be calling him within a week or so to schedule that appointment with them

## 2023-10-23 ENCOUNTER — Telehealth: Payer: Self-pay | Admitting: Family Medicine

## 2023-10-23 DIAGNOSIS — D751 Secondary polycythemia: Secondary | ICD-10-CM | POA: Insufficient documentation

## 2023-10-23 DIAGNOSIS — E782 Mixed hyperlipidemia: Secondary | ICD-10-CM | POA: Insufficient documentation

## 2023-10-23 NOTE — Assessment & Plan Note (Addendum)
 Likely due to OSA Needs to focus on diet modification in an effort to lose weight Would need CBC monitoring

## 2023-10-23 NOTE — Telephone Encounter (Signed)
 Referrals to Sleep Medicine and Cardiology have been sent.  Mychart Messages have been sent to Patient with Specialty Office contact information.

## 2023-10-23 NOTE — Assessment & Plan Note (Signed)
 Lab Results  Component Value Date   HGBA1C 6.0 (H) 10/15/2023   Advised to follow DASH diet

## 2023-10-23 NOTE — Assessment & Plan Note (Signed)
 BP Readings from Last 1 Encounters:  10/20/23 (!) 146/90   Uncontrolled with valsartan  80 mg QD and amlodipine  10 mg QD Increased dose of Valsartan  to 160 mg once daily Advised to continue amlodipine  10 mg QD Counseled for compliance with the medications Advised DASH diet and moderate exercise/walking, at least 150 mins/week

## 2023-10-23 NOTE — Telephone Encounter (Signed)
 Copied from CRM [id number]. Topic: Referral - Status >> Oct 23, 2023 10:17 AM Bruce Koch wrote: Reason for CRM: Pt would like an update on referral# [id number]. Please call pt at [phone].

## 2023-10-23 NOTE — Assessment & Plan Note (Signed)
 On Crestor  20 mg QD Advised to follow DASH diet

## 2023-10-25 ENCOUNTER — Other Ambulatory Visit: Payer: Self-pay | Admitting: Family Medicine

## 2023-11-06 ENCOUNTER — Ambulatory Visit (INDEPENDENT_AMBULATORY_CARE_PROVIDER_SITE_OTHER): Payer: Self-pay | Admitting: Neurology

## 2023-11-06 ENCOUNTER — Encounter: Payer: Self-pay | Admitting: Neurology

## 2023-11-06 ENCOUNTER — Telehealth: Payer: Self-pay

## 2023-11-06 VITALS — BP 152/92 | HR 80 | Ht 73.0 in | Wt 367.4 lb

## 2023-11-06 DIAGNOSIS — R030 Elevated blood-pressure reading, without diagnosis of hypertension: Secondary | ICD-10-CM

## 2023-11-06 DIAGNOSIS — Z9189 Other specified personal risk factors, not elsewhere classified: Secondary | ICD-10-CM | POA: Diagnosis not present

## 2023-11-06 DIAGNOSIS — R0683 Snoring: Secondary | ICD-10-CM | POA: Diagnosis not present

## 2023-11-06 DIAGNOSIS — G4719 Other hypersomnia: Secondary | ICD-10-CM

## 2023-11-06 DIAGNOSIS — R6889 Other general symptoms and signs: Secondary | ICD-10-CM

## 2023-11-06 DIAGNOSIS — E349 Endocrine disorder, unspecified: Secondary | ICD-10-CM

## 2023-11-06 DIAGNOSIS — Z6841 Body Mass Index (BMI) 40.0 and over, adult: Secondary | ICD-10-CM

## 2023-11-06 DIAGNOSIS — R351 Nocturia: Secondary | ICD-10-CM

## 2023-11-06 NOTE — Telephone Encounter (Signed)
 Copied from CRM [id number]. Topic: Referral - Question >> Nov 05, 2023  4:46 PM Turkey B wrote: Reason for CRM: pt still waiting on status of referral, put I July 1. Please cb

## 2023-11-06 NOTE — Telephone Encounter (Signed)
 Referral sent to: [facility] [phone] [address] [phone]  MyChart Message sent to Patient with Specialty Office contact information.

## 2023-11-06 NOTE — Patient Instructions (Signed)

## 2023-11-06 NOTE — Progress Notes (Signed)
 Subjective:    Patient ID: Bruce Koch is a 53 y.o. male.  HPI    True Mar, MD, PhD [facility] [address], [address] [address] [place], [place] [address]  Dear Bruce Koch,  I saw your patient, Bruce Koch, upon your kind request in my sleep clinic today for initial consultation of his sleep disorder, in particular, concern for underlying obstructive sleep apnea.  The patient is unaccompanied today.  As you know, Mr. Livecchi is a 53 year old male with an underlying medical history of hypertension, testosterone  deficiency, impaired fasting glucose, vitamin D  deficiency, hyperlipidemia, depression, and morbid obesity with a BMI of over 45, who reports snoring and excessive daytime somnolence.  His Epworth sleepiness score is 7 out of 24, fatigue severity score is 43 out of 63.  He is currently out of work.  He is a Naval architect.  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  He sleeps long hours currently, goes to bed around 11 and gets up around 11.  He lives with his family, including wife and 2 children.  They have 1 dog and 1 cat in the household, the pets do not sleep in the bedroom with them.  He does have a TV in the bedroom but it is not on at night.  He has had weight fluctuation, he is working on weight loss but lately gained about 10 pounds in the recent past.  He is not aware of any family history of sleep apnea.  He is familiar with the diagnosis as his wife had a diagnosis of sleep apnea.  He smokes cigars, Black and mild, about 3 to 4/day but previously 1 pack/day.  I reviewed your office note from 10/15/2023. He has significant nocturia about 4-5 times per average night in the past few days, previously about 2-3 times per night.  He has an elevated blood pressure today and has not taken his blood pressure medicine yet.  He does get tearful during this visit, reports feeling stressed about not keeping his insurance.  He reports that he  has applied for Medicaid.  He will call them again today.  He denies recurrent nocturnal morning headaches.  His Past Medical History Is Significant For: Past Medical History:  Diagnosis Date   Depression 12/21/2013   Hyperlipidemia    Hypertension    Toe fracture     His Past Surgical History Is Significant For: No past surgical history on file.  His Family History Is Significant For: Family History  Problem Relation Age of Onset   Alcohol abuse Mother        history    Hypertension Mother    Diabetes Father    Drug abuse Father        cocaine overdose    Alcohol abuse Sister    Alcohol abuse Brother     His Social History Is Significant For: Social History   Socioeconomic History   Marital status: Married    Spouse name: Not on file   Number of children: 2   Years of education: 12    Highest education level: Not on file  Occupational History   Occupation: Unemployed   Tobacco Use   Smoking status: Every Day    Types: Cigars   Smokeless tobacco: Never   Tobacco comments:    He smokes Black and Milds Daily  Vaping Use   Vaping status: Never Used  Substance and Sexual Activity   Alcohol use:  Yes    Comment: rarely    Drug use: Not Currently    Types: Marijuana, Cocaine   Sexual activity: Yes  Other Topics Concern   Not on file  Social History Narrative   Lives with wife (married since 2014) and 2 children age 35 and 31.    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   amLODipine  (NORVASC ) 10 MG tablet TAKE 1 TABLET(10 MG) BY MOUTH DAILY   metFORMIN  (GLUCOPHAGE ) 500 MG tablet Take 1 tablet (500 mg total) by mouth daily with breakfast.   rosuvastatin  (CRESTOR ) 20 MG tablet TAKE 1 TABLET(20 MG) BY MOUTH DAILY   tamsulosin (FLOMAX) 0.4 MG CAPS capsule Take 0.4 mg by mouth daily.   valsartan  (DIOVAN ) 160 MG tablet Take 1 tablet (160 mg total) by mouth daily.   albuterol  (VENTOLIN  HFA) 108 (90 Base) MCG/ACT inhaler Inhale 1-2 puffs into the lungs every 6 (six) hours as needed for wheezing or shortness of breath.   cyclobenzaprine  (FLEXERIL ) 10 MG tablet Take 1 tablet (10 mg total) by mouth 2 (two) times daily as needed for muscle spasms.   No facility-administered encounter medications on file as of 11/06/2023.  :   Review of Systems:  Out of a complete 14 point review of systems, all are reviewed and negative with the exception of these symptoms as listed below:  Review of Systems  Neurological:        DOT requesting consult.  Snoring, wakes up tired sometimes. No sleep study or consult before.  ESS 7 FSS  43.     Objective:  Neurological Exam  Physical Exam Physical Examination:   Vitals:   11/06/23 1250  BP: (!) 152/92  Pulse: 80    General Examination: The patient is a very pleasant 53 y.o. male in no acute distress. He appears well-developed and well-nourished and well groomed.  He is tearful towards the end of the visit, he indicates feeling stressed regarding his insurance situation and having to pursue sleep testing.  HEENT: Normocephalic, atraumatic, pupils are equal, round and reactive to light, extraocular tracking is good without limitation to gaze excursion or nystagmus noted. Hearing is grossly intact. Face is symmetric with normal facial animation. Speech is clear with no dysarthria noted. There is no hypophonia. There is no lip, neck/head, jaw or voice tremor. Neck is supple with  full range of passive and active motion. There are no carotid bruits on auscultation. Oropharynx exam reveals: mild mouth dryness, adequate dental hygiene and moderate to marked airway crowding secondary to small airway entry, thicker soft palate, larger tongue, larger uvula, Mallampati class IV, neck circumference is 19-1/2 inches, no significant overbite noted, slightly skewed teeth alignment noted.  Tip of uvula and tonsils not fully visualized.    Chest: Clear to auscultation without wheezing, rhonchi or crackles noted.  Heart: S1+S2+0, regular and normal without murmurs,  rubs or gallops noted.   Abdomen: Soft, non-tender and non-distended.  Extremities: There is trace pitting edema in the distal lower extremities bilaterally.   Skin: Warm and dry without trophic changes noted.   Musculoskeletal: exam reveals no obvious joint deformities.   Neurologically:  Mental status: The patient is awake, alert and oriented in all 4 spheres. His immediate and remote memory, attention, language skills and fund of knowledge are appropriate. There is no evidence of aphasia, agnosia, apraxia or anomia. Speech is clear with normal prosody and enunciation. Thought process is linear. Mood is normal and affect is normal.  Cranial nerves II - XII are as described above under HEENT exam.  Motor exam: Normal bulk, strength and tone is noted. There is no obvious action or resting tremor.  Fine motor skills and coordination: grossly intact.  Cerebellar testing: No dysmetria or intention tremor. There is no truncal or gait ataxia.  Sensory exam: intact to light touch in the upper and lower extremities.  Gait, station and balance: He stands easily. No veering to one side is noted. No leaning to one side is noted. Posture is age-appropriate and stance is narrow based. Gait shows normal stride length and normal pace. No problems turning are noted.   Assessment and Plan:  In summary, SHAHRUKH PASCH is a very  pleasant 53 y.o.-year old male with an underlying medical history of hypertension, testosterone  deficiency, impaired fasting glucose, vitamin D  deficiency, hyperlipidemia, depression, and morbid obesity with a BMI of over 45, whose history and physical exam are concerning for sleep disordered breathing, particularly obstructive sleep apnea (OSA). A laboratory attended sleep study is typically considered gold standard for evaluation of sleep disordered breathing.   I had a long chat with the patient about my findings and the diagnosis of sleep apnea, particularly OSA, its prognosis and treatment options. We talked about medical/conservative treatments, surgical interventions and non-pharmacological approaches for symptom control. I explained, in particular, the risks and ramifications of untreated moderate to severe OSA, especially with respect to developing cardiovascular disease down the road, including congestive heart failure (CHF), difficult to treat hypertension, cardiac arrhythmias (particularly A-fib), neurovascular complications including TIA, stroke and dementia. Even type 2 diabetes has, in part, been linked to untreated OSA. Symptoms of untreated OSA may include (but may not be limited to) daytime sleepiness, nocturia (i.e. frequent nighttime urination), memory problems, mood irritability and suboptimally controlled or worsening mood disorder such as depression and/or anxiety, lack of energy, lack of motivation, physical discomfort, as well as recurrent headaches, especially morning or nocturnal headaches. We talked about the importance of maintaining a healthy lifestyle and striving for healthy weight.  The importance of complete smoking cessation was also addressed.  In addition, we talked about the importance of striving for and maintaining good sleep hygiene. I recommended a sleep study at this time. I outlined the differences between a laboratory attended sleep study which is considered more  comprehensive and accurate over the option of a home sleep test (HST); the latter may lead to underestimation of sleep disordered breathing in some instances and does not help with diagnosing upper airway resistance syndrome and is not accurate enough to diagnose primary central sleep apnea typically. I outlined possible surgical and non-surgical treatment options of OSA, including the use of a positive airway pressure (PAP) device (i.e. CPAP, AutoPAP/APAP or BiPAP in certain circumstances), a custom-made dental device (aka oral appliance, which would require a referral to a specialist dentist or orthodontist typically, and is generally  speaking not considered for patients with full dentures or edentulous state), upper airway surgical options, such as traditional UPPP (which is not considered a first-line treatment) or the Inspire device (hypoglossal nerve stimulator, which would involve a referral for consultation with an ENT surgeon, after careful selection, following inclusion criteria - also not first-line treatment). I explained the PAP treatment option to the patient in detail, as this is generally considered first-line treatment.  The patient indicated that he would be willing to try PAP therapy, if the need arises. I explained the importance of being compliant with PAP treatment, not only for insurance purposes but primarily to improve patient's symptoms symptoms, and for the patient's long term health benefit, including to reduce His cardiovascular risks longer-term.    We will pick up our discussion about the next steps and treatment options after testing.  We will keep him posted as to the test results by phone call and/or MyChart messaging where possible.  We will plan to follow-up in sleep clinic accordingly as well.  I answered all his questions today and the patient was in agreement.   I encouraged him to call with any interim questions, concerns, problems or updates or email us  through MyChart.   Generally speaking, sleep test authorizations may take up to 2 weeks, sometimes less, sometimes longer, the patient is encouraged to get in touch with us  if they do not hear back from the sleep lab staff directly within the next 2 weeks.  Thank you very much for allowing me to participate in the care of this nice patient. If I can be of any further assistance to you please do not hesitate to call me at [phone].  Sincerely,   True Mar, MD, PhD

## 2023-11-19 ENCOUNTER — Telehealth: Payer: Self-pay | Admitting: Neurology

## 2023-11-19 NOTE — Telephone Encounter (Signed)
 [facility] [employer] pending

## 2023-11-19 NOTE — Telephone Encounter (Signed)
 [employer] [employer] no auth req via fax   Sent mychart.

## 2023-11-19 NOTE — Telephone Encounter (Signed)
 [facility] [employer] no auth req via fax   Patient is scheduled at [facility] for 12/17/23 at 8 pm  Mailed packet and sent mychart.

## 2023-12-08 ENCOUNTER — Encounter: Payer: Self-pay | Admitting: Family Medicine

## 2023-12-08 ENCOUNTER — Ambulatory Visit (INDEPENDENT_AMBULATORY_CARE_PROVIDER_SITE_OTHER): Admitting: Family Medicine

## 2023-12-08 VITALS — BP 148/90 | HR 74 | Ht 73.0 in | Wt 368.0 lb

## 2023-12-08 DIAGNOSIS — R350 Frequency of micturition: Secondary | ICD-10-CM | POA: Diagnosis not present

## 2023-12-08 DIAGNOSIS — A64 Unspecified sexually transmitted disease: Secondary | ICD-10-CM

## 2023-12-08 MED ORDER — DOXYCYCLINE HYCLATE 100 MG PO TABS: 100.0000 mg | ORAL_TABLET | Freq: Two times a day (BID) | ORAL | 0 refills | Status: AC

## 2023-12-08 NOTE — Progress Notes (Unsigned)
 Acute Office Visit  Subjective:    Patient ID: Bruce Koch, male    DOB: [birth year], 53 y.o.   MRN: [id number]  Chief Complaint  Patient presents with   Urinary Frequency    Frequent urination with odor    urea plasma    Wife tested positive for urea plasma, husband wants to know next steps for him    HPI Patient is in today for the above complaints.  For the details of today's visit, please refer to the assessment and plan.    Past Medical History:  Diagnosis Date   Depression 12/21/2013   Hyperlipidemia    Hypertension    Toe fracture     No past surgical history on file.  Family History  Problem Relation Age of Onset   Alcohol abuse Mother        history    Hypertension Mother    Diabetes Father    Drug abuse Father        cocaine overdose    Alcohol abuse Sister    Alcohol abuse Brother     Social History   Socioeconomic History   Marital status: Married    Spouse name: Not on file   Number of children: 2   Years of education: 12    Highest education level: Not on file  Occupational History   Occupation: Unemployed   Tobacco Use   Smoking status: Every Day    Types: Cigars   Smokeless tobacco: Never   Tobacco comments:    He smokes Black and Milds Daily  Vaping Use   Vaping status: Never Used  Substance and Sexual Activity   Alcohol use: Yes    Comment: rarely    Drug use: Not Currently    Types: Marijuana, Cocaine   Sexual activity: Yes  Other Topics Concern   Not on file  Social History Narrative   Lives with wife (married since 2014) and 2 children age 59 and 91.    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 18 g 0   albuterol  (VENTOLIN  HFA) 108 (90 Base) MCG/ACT inhaler Inhale 1-2 puffs into the lungs every 6 (six) hours as needed for wheezing or shortness of breath. 6.7 each 0   amLODipine  (NORVASC ) 10 MG tablet TAKE 1 TABLET(10 MG) BY MOUTH DAILY 90 tablet 0   cyclobenzaprine  (FLEXERIL ) 10 MG tablet Take 1 tablet (10 mg total) by mouth 2 (two) times daily as needed for muscle spasms. 10 tablet 0   metFORMIN  (GLUCOPHAGE ) 500 MG tablet Take 1 tablet (500 mg total) by mouth daily with breakfast. 90 tablet 1   rosuvastatin  (CRESTOR ) 20 MG tablet TAKE 1 TABLET(20 MG) BY MOUTH DAILY 90 tablet 3   tamsulosin (FLOMAX) 0.4 MG CAPS capsule Take 0.4 mg by mouth daily.     valsartan  (DIOVAN ) 160 MG tablet Take 1 tablet (160 mg total) by mouth  daily. 30 tablet 1   No facility-administered medications prior to visit.    No Known Allergies  Review of Systems  Constitutional:  Negative for fatigue and fever.  Eyes:  Negative for visual disturbance.  Respiratory:  Negative for chest tightness and shortness of breath.   Cardiovascular:  Negative for chest pain and palpitations.  Neurological:  Negative for dizziness and headaches.       Objective:    Physical Exam HENT:     Head: Normocephalic.     Right Ear: External ear normal.     Left Ear: External ear normal.     Nose: No congestion or rhinorrhea.     Mouth/Throat:     Mouth: Mucous membranes are moist.  Cardiovascular:     Rate and Rhythm: Regular rhythm.     Heart sounds: No murmur heard. Pulmonary:     Effort: No respiratory distress.     Breath sounds: Normal breath sounds.  Neurological:     Mental Status: He is alert.     BP (!) 148/90   Pulse 74   Ht 6' 1 (1.854 m)   Wt (!) 368 lb (166.9 kg)   SpO2 90%   BMI 48.55 kg/m   Wt Readings from Last 3 Encounters:  12/08/23 (!) 368 lb (166.9 kg)  11/06/23 (!) 367 lb 6.4 oz (166.7 kg)  10/20/23 (!) 358 lb 12.8 oz (162.8 kg)       Assessment & Plan:  STD (male) Assessment & Plan: Will treat prophylactically with doxycycline  for 7 days given that the patient is symptomatic and his wife has tested positive. Urinalysis negative for UTI. Pending GC/CT and Trichomonas testing. Nonpharmacological interventions for prevention of Ureaplasma were discussed, including consistent condom use, limiting the number of sexual partners, abstaining from sexual activity until both partners have completed treatment and symptoms have resolved, and maintaining open communication with partners regarding STI testing and treatment.   Orders: -     Doxycycline  Hyclate; Take 1 tablet (100 mg total) by mouth 2 (two) times daily for 7 days.  Dispense: 14 tablet; Refill: 0 -     Chlamydia/Gonococcus/Trichomonas, NAA  Frequent urination -     Urine Culture -     Urinalysis  Note: This chart has been completed using Engineer, civil (consulting) software, and while attempts have been made to ensure accuracy, certain words and phrases may not be transcribed as intended.    Marvelous Woolford, FNP

## 2023-12-08 NOTE — Patient Instructions (Addendum)
 I appreciate the opportunity to provide care to you today!    Please complete the full course of treatment and return in month for retest   Please follow up if your symptoms worsen or fail to improve.     Please continue to a heart-healthy diet and increase your physical activities. Try to exercise for at least five days a week.    It was a pleasure to see you and I look forward to continuing to work together on your health and well-being. Please do not hesitate to call the office if you need care or have questions about your care.  In case of emergency, please visit the Emergency Department for urgent care, or contact our clinic at [phone] to schedule an appointment. We're here to help you!   Have a wonderful day and week. With Gratitude, Anevay Campanella MSN, FNP-BC

## 2023-12-09 DIAGNOSIS — A64 Unspecified sexually transmitted disease: Secondary | ICD-10-CM | POA: Insufficient documentation

## 2023-12-09 NOTE — Assessment & Plan Note (Signed)
 Will treat prophylactically with doxycycline  for 7 days given that the patient is symptomatic and his wife has tested positive. Urinalysis negative for UTI. Pending GC/CT and Trichomonas testing. Nonpharmacological interventions for prevention of Ureaplasma were discussed, including consistent condom use, limiting the number of sexual partners, abstaining from sexual activity until both partners have completed treatment and symptoms have resolved, and maintaining open communication with partners regarding STI testing and treatment.

## 2023-12-10 ENCOUNTER — Ambulatory Visit (INDEPENDENT_AMBULATORY_CARE_PROVIDER_SITE_OTHER): Admitting: "Endocrinology

## 2023-12-10 ENCOUNTER — Encounter: Payer: Self-pay | Admitting: "Endocrinology

## 2023-12-10 VITALS — BP 144/94 | HR 76 | Ht 73.0 in | Wt 373.6 lb

## 2023-12-10 DIAGNOSIS — Z6841 Body Mass Index (BMI) 40.0 and over, adult: Secondary | ICD-10-CM

## 2023-12-10 DIAGNOSIS — I10 Essential (primary) hypertension: Secondary | ICD-10-CM

## 2023-12-10 DIAGNOSIS — E291 Testicular hypofunction: Secondary | ICD-10-CM | POA: Insufficient documentation

## 2023-12-10 DIAGNOSIS — E782 Mixed hyperlipidemia: Secondary | ICD-10-CM

## 2023-12-10 LAB — URINALYSIS
Glucose, UA: NEGATIVE
Ketones, UA: NEGATIVE
Leukocytes,UA: NEGATIVE
Nitrite, UA: NEGATIVE
RBC, UA: NEGATIVE
Specific Gravity, UA: >1.03 — ABNORMAL HIGH (ref 1.005–1.030)
Urobilinogen, Ur: 0.2 mg/dL (ref 0.2–1.0)
pH, UA: 6 (ref 5.0–7.5)

## 2023-12-10 NOTE — Progress Notes (Signed)
 Endocrinology Consult Note                                            12/10/2023, 2:01 PM   Subjective:    Patient ID: Bruce Koch, male    DOB: [birth year], PCP Del Wilhelmena Falter, Hilario, FNP   Past Medical History:  Diagnosis Date   Depression 12/21/2013   Hyperlipidemia    Hypertension    Toe fracture    History reviewed. No pertinent surgical history. Social History   Socioeconomic History   Marital status: Married    Spouse name: Not on file   Number of children: 2   Years of education: 12    Highest education level: Not on file  Occupational History   Occupation: Unemployed   Tobacco Use   Smoking status: Every Day    Types: Cigars   Smokeless tobacco: Never   Tobacco comments:    He smokes Black and Milds Daily  Vaping Use   Vaping status: Never Used  Substance and Sexual Activity   Alcohol use: Yes    Comment: rarely    Drug use: Not Currently    Types: Marijuana, Cocaine   Sexual activity: Yes  Other Topics Concern   Not on file  Social History Narrative   Lives with wife (married since 2014) and 2 children age 19 and 27.    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, dog and Medical laboratory scientific officer   Social Drivers of Corporate investment banker Strain: Not on file  Food Insecurity: Not on file  Transportation Needs: Not on file  Physical Activity: Not on file  Stress: Not on file  Social Connections: Not on file   Family History  Problem Relation Age of Onset   Alcohol abuse Mother        history    Hypertension Mother    Diabetes Father    Drug abuse Father        cocaine overdose    Alcohol abuse Sister    Alcohol abuse Brother    Outpatient Encounter Medications as of 12/10/2023  Medication Sig   Multiple Vitamins-Minerals (MULTIVITAMIN MEN PO) Take 1 tablet by mouth daily.   albuterol  (VENTOLIN  HFA) 108 (90 Base) MCG/ACT inhaler Inhale 1-2 puffs into the lungs every 6 (six) hours as needed for wheezing or shortness of  breath.   albuterol  (VENTOLIN  HFA) 108 (90 Base) MCG/ACT inhaler Inhale 1-2 puffs into the lungs every 6 (six) hours as needed for wheezing or shortness of breath.   amLODipine  (NORVASC ) 10 MG tablet TAKE 1 TABLET(10 MG) BY MOUTH DAILY   cyclobenzaprine  (FLEXERIL ) 10 MG tablet Take 1 tablet (10 mg total) by mouth 2 (two) times daily as needed for muscle spasms.   doxycycline  (VIBRA -TABS) 100 MG tablet Take 1 tablet (100 mg total) by mouth 2 (two) times daily for 7 days.   metFORMIN  (GLUCOPHAGE ) 500 MG tablet Take 1 tablet (500 mg total) by mouth daily with breakfast.   rosuvastatin  (CRESTOR ) 20 MG tablet TAKE 1 TABLET(20 MG) BY MOUTH DAILY   tamsulosin (FLOMAX) 0.4 MG CAPS capsule Take 0.4 mg by mouth daily. (Patient not taking: Reported on 12/10/2023)   valsartan  (DIOVAN ) 160 MG tablet Take 1 tablet (160 mg total) by mouth daily.   No facility-administered encounter medications  on file as of 12/10/2023.   ALLERGIES: No Known Allergies  VACCINATION STATUS: Immunization History  Administered Date(s) Administered   Tdap 10/17/2011, 09/04/2021    HPI Bruce Koch is 53 y.o. male who presents today with a medical history as above. he is being seen in consultation for hypogonadism requested by Del Wilhelmena Lloyd Sola, FNP.  History is obtained from the patient as well as chart review.  Patient is not an optimal historian. Due to fatigue, low libido he was offered baseline testosterone  measurement in June 2025 when he was found to have total testosterone  of 294 and low free testosterone  of 4.0.  No measurement of gonadotropins, prolactin nor hematocrit.  Patient gives history of ingesting various energy boosting products from online markets including 1 which he brought with him showing laced with testosterone . During the same lab work, he was found to have erythrocytosis and increased transaminases. Patient struggles with overweight/obesity for most of his adult life with prediabetes,  recent A1c of 6%. He is scheduled to have sleep study, denies snoring. He denies any history of testicular injury, radiation, nor chemotherapy. He denies any significant history of head injury.  He has 2 children and ages 68 and 51. His history also shows several prescriptions for hydrocodone , steroids for various reasons. He is other medical problems include hypertension and hyperlipidemia on treatment.  He is a smoker.  He would like to have workup and treatment for hypogonadism and would like to achieve some weight loss. Patient denies history of coronary disease, CVA, nor history of coagulopathy.   He is a sedentary at baseline.  He does not follow any particular diet or exercise program.   Review of Systems  Constitutional: + Progressively gaining weight,  no fatigue, no subjective hyperthermia, no subjective hypothermia Eyes: no blurry vision, no xerophthalmia ENT: no sore throat, no nodules palpated in throat, no dysphagia/odynophagia, no hoarseness Cardiovascular: no Chest Pain, no Shortness of Breath, no palpitations, no leg swelling Respiratory: no cough, no shortness of breath Gastrointestinal: no Nausea/Vomiting/Diarhhea Musculoskeletal: no muscle/joint aches Skin: no rashes Neurological: no tremors, no numbness, no tingling, no dizziness Psychiatric: no depression, no anxiety  Objective:       12/10/2023    1:25 PM 12/08/2023    1:43 PM 11/06/2023   12:50 PM  Vitals with BMI  Height 6' 1 6' 1 6' 1  Weight 373 lbs 10 oz 368 lbs 367 lbs 6 oz  BMI 49.3 48.56 48.48  Systolic 144 148 847  Diastolic 94 90 92  Pulse 76 74 80    BP (!) 144/94   Pulse 76   Ht 6' 1 (1.854 m)   Wt (!) 373 lb 9.6 oz (169.5 kg)   BMI 49.29 kg/m   Wt Readings from Last 3 Encounters:  12/10/23 (!) 373 lb 9.6 oz (169.5 kg)  12/08/23 (!) 368 lb (166.9 kg)  11/06/23 (!) 367 lb 6.4 oz (166.7 kg)    Physical Exam  Constitutional:  Body mass index is 49.29 kg/m.,  not in acute  distress, normal state of mind Eyes: PERRLA, EOMI, no exophthalmos ENT: moist mucous membranes, no gross thyromegaly, no gross cervical lymphadenopathy Cardiovascular: normal precordial activity, Regular Rate and Rhythm, no Murmur/Rubs/Gallops Respiratory:  adequate breathing efforts, no gross chest deformity, Clear to auscultation bilaterally Gastrointestinal: abdomen soft, Non -tender, No distension, Bowel Sounds present, no gross organomegaly Musculoskeletal: no gross deformities, strength intact in all four extremities, no peripheral edema Skin: moist, warm, no rashes  Genital: Testicles  are 25 cc bilaterally, no intrascrotal mass lesion.  No gross femoral or inguinal hernia.  Male escutcheon, uncircumcised. Neurological: no tremor with outstretched hands, Deep tendon reflexes normal in bilateral lower extremities.  CMP ( most recent) CMP     Component Value Date/Time   NA 138 10/15/2023 1057   K 4.2 10/15/2023 1057   CL 102 10/15/2023 1057   CO2 19 (L) 10/15/2023 1057   GLUCOSE 98 10/15/2023 1057   GLUCOSE 108 (H) 04/24/2015 2259   BUN 17 10/15/2023 1057   CREATININE 0.84 10/15/2023 1057   CREATININE 1.12 06/07/2014 1605   CALCIUM  9.3 10/15/2023 1057   PROT 6.7 10/15/2023 1057   ALBUMIN 4.2 10/15/2023 1057   AST 46 (H) 10/15/2023 1057   ALT 63 (H) 10/15/2023 1057   ALKPHOS 76 10/15/2023 1057   BILITOT <0.2 10/15/2023 1057   EGFR 105 10/15/2023 1057   GFRNONAA >60 04/24/2015 2259   GFRNONAA 80 06/07/2014 1605     Diabetic Labs (most recent): Lab Results  Component Value Date   HGBA1C 6.0 (H) 10/15/2023   HGBA1C 5.6 10/17/2022   HGBA1C 5.8 (H) 07/19/2022     Lipid Panel ( most recent) Lipid Panel     Component Value Date/Time   CHOL 144 10/15/2023 1057   TRIG 80 10/15/2023 1057   HDL 51 10/15/2023 1057   CHOLHDL 2.8 10/15/2023 1057   CHOLHDL 4.2 06/07/2014 1605   VLDL 77 (H) 06/07/2014 1605   LDLCALC 78 10/15/2023 1057   LABVLDL 15 10/15/2023 1057       Lab Results  Component Value Date   TSH 1.580 10/15/2023   TSH 0.983 07/19/2022   TSH 1.158 06/07/2014   FREET4 1.11 10/15/2023   FREET4 1.05 07/19/2022      Assessment & Plan:   1. Hypogonadism, male (Primary) 2. Morbid obesity (HCC) 3. Essential hypertension, benign 4.  Hyperlipidemia  - Bruce Koch  is being seen at a kind request of Del Wilhelmena Falter, Hilario, FNP. - I have reviewed his available  records and clinically evaluated the patient. - Based on these reviews, he has metabolic syndrome indicated by morbid obesity,hyperlipidemia, hypertension, prediabetes; mild hypogonadism however,  there is not sufficient information to proceed with definitive treatment plan.  Patient is advised to hold off any or all over-the-counter energy boosters that he has been using. He will have repeat labs in 2 weeks for the following: - Prolactin - Ferritin - Luteinizing hormone - Follicle stimulating hormone - CBC with Differential/Platelet - PSA - Testosterone , Free, Total, SHBG He has developed erythrocytosis and transaminitis while using over-the-counter products.  I discussed safe use of testosterone  if he is confirmed to have organic hypogonadism based on his labs. He is advised to complete the sleep study scheduled for him.  He will benefit the most from weight loss.  He is also advised to consider smoking cessation.  He has prediabetes, advised to continue metformin  500 mg p.o. once a day - he acknowledges that there is a room for improvement in his food and drink choices. - Suggestion is made for him to avoid simple carbohydrates  from his diet including Cakes, Sweet Desserts, Ice Cream, Soda (diet and regular), Sweet Tea, Candies, Chips, Cookies, Store Bought Juices, Alcohol , Artificial Sweeteners,  Coffee Creamer, and Sugar-free Products, Lemonade. This will help patient to have more stable blood glucose profile and potentially avoid unintended weight gain.  He  is advised to continue his Crestor  20 mg p.o. nightly, Diovan  160  mg p.o. once a day, amlodipine  10 mg p.o. once a day.   - he is advised to maintain close follow up with Del Wilhelmena Lloyd Sola, FNP for primary care needs.   -Thank you for involving me in the care of this pleasant patient.  Time spent with the patient: 60  minutes spent in  counseling him about hypogonadism, metabolic syndrome and the rest in obtaining information about his symptoms, reviewing his previous labs/studies (including abstractions from other facilities),  evaluations, and treatments,  and developing a plan to confirm diagnosis and long term treatment based on the latest standards of care/guidelines; and documenting his care.  Bruce Koch participated in the discussions, expressed understanding, and voiced agreement with the above plans.  All questions were answered to his satisfaction. he is encouraged to contact clinic should he have any questions or concerns prior to his return visit.  Follow up plan: Return in about 4 weeks (around 01/07/2024), or Do labs after 2 weeks, for Fasting Labs  in AM B4 8, A1c -NV.   Ranny Earl, MD [facility] [address] [place], [place] [address] Phone: [phone]  Fax: [phone]     12/10/2023, 2:01 PM  This note was partially dictated with voice recognition software. Similar sounding words can be transcribed inadequately or may not  be corrected upon review.

## 2023-12-10 NOTE — Patient Instructions (Signed)

## 2023-12-11 ENCOUNTER — Telehealth: Payer: Self-pay

## 2023-12-11 ENCOUNTER — Ambulatory Visit: Payer: Self-pay | Admitting: Family Medicine

## 2023-12-11 LAB — CHLAMYDIA/GONOCOCCUS/TRICHOMONAS, NAA
Chlamydia by NAA: NEGATIVE
Gonococcus by NAA: NEGATIVE
Trich vag by NAA: NEGATIVE

## 2023-12-11 LAB — URINE CULTURE

## 2023-12-11 NOTE — Telephone Encounter (Signed)
 Copied from CRM [id number]. Topic: Clinical - Lab/Test Results >> Dec 10, 2023 12:24 PM DeAngela L wrote: Reason for CRM: patient calling for Lab results from urine test   Patient is calling cause he has questions about Ureaplasma and would like to ask how can he find more info or is there a way the office could call him and explain more about it   Pt num [phone] (M)

## 2023-12-11 NOTE — Telephone Encounter (Signed)
 Awaiting provider to review

## 2023-12-12 ENCOUNTER — Ambulatory Visit: Admitting: Family Medicine

## 2023-12-12 VITALS — BP 135/88 | HR 82 | Wt 370.0 lb

## 2023-12-12 DIAGNOSIS — Z6841 Body Mass Index (BMI) 40.0 and over, adult: Secondary | ICD-10-CM | POA: Diagnosis not present

## 2023-12-12 DIAGNOSIS — E66813 Obesity, class 3: Secondary | ICD-10-CM | POA: Diagnosis not present

## 2023-12-12 MED ORDER — SEMAGLUTIDE-WEIGHT MANAGEMENT 0.25 MG/0.5ML ~~LOC~~ SOAJ: 0.2500 mg | SUBCUTANEOUS | 0 refills | Status: AC

## 2023-12-12 MED ORDER — NITROGLYCERIN 0.4 MG SL SUBL: 0.4000 mg | SUBLINGUAL_TABLET | SUBLINGUAL | 3 refills | Status: AC | PRN

## 2023-12-12 NOTE — Assessment & Plan Note (Signed)
 Trial on Wegovy  0.25 mg weekly Discussed Eat a Balanced Diet: Focus on whole, nutrient-dense foods like lean proteins, vegetables, fruits, whole grains, and healthy fats while avoiding processed and sugary foods. Stay Active: Incorporate at least 30 minutes of moderate physical activity most days of the week, such as walking, jogging, or strength training. Hydrate and Rest: Drink plenty of water throughout the day and ensure you get 7-9 hours of quality sleep each night to support metabolism and recovery. Practice Portion Control: Use smaller plates, measure portions, and eat mindfully to avoid overeating and manage calorie intake effectively.  Patient agreed to the plan of care

## 2023-12-12 NOTE — Patient Instructions (Addendum)
        Great to see you today.  I have refilled the medication(s) we provide.   If labs were collected, we will inform you of lab results once received either by echart message or telephone call.   - echart message- for normal results that have been seen by the patient already.   - telephone call: abnormal results or if patient has not viewed results in their echart.   - Please take medications as prescribed. - Follow up with your primary health provider if any health concerns arises. - If symptoms worsen please contact your primary care provider and/or visit the emergency department.       Wegovy (Semaglutide) Titration Schedule Month 1: 0.25 mg once a week. Month 2: 0.5 mg once a week. Month 3: 1 mg once a week. Month 4: 1.7 mg once a week. Month 5 and Beyond: 2.4 mg once a week (maintenance dose).  Common Side Effects of Wegovy Gastrointestinal (most common):Nausea, Vomiting, Diarrhea. Constipation. Stomach pain  OTC Medications for Wegovy Side Effects Nausea: Ginger supplements or Dramamine (dimenhydrinate). Diarrhea: Imodium (loperamide). Constipation: Colace (docusate) or fiber supplements like psyllium. Stomach Pain/Indigestion: Tums or Pepcid (famotidine). Headache: Tylenol (acetaminophen) or Advil (ibuprofen).

## 2023-12-12 NOTE — Progress Notes (Signed)
 Established Patient Office Visit   Subjective  Patient ID: Bruce Koch, male    DOB: [birth year]  Age: 53 y.o. MRN: [id number]  Chief Complaint  Patient presents with   Hypertension   Weight Management Screening    Zepbound, semaglutide , wegovy     He  has a past medical history of Depression (12/21/2013), Hyperlipidemia, Hypertension, and Toe fracture.  Obesity: Patient complains of obesity. Patient cites health, increased physical ability, self-image as reasons for wanting to lose weight.  Obesity History Weight in late teens: 240 lb. Period of greatest weight gain: 60 lb during mid adult years Lowest adult weight: 358 Highest adult weight: 390 Amount of time at present weight:  370 lb.   History of Weight Loss Efforts Greatest amount of weight lost: 30 lb over 12 months Amount of time that loss was maintained: 12 months  Successful weight loss techniques attempted: self-directed dieting Unsuccessful weight loss techniques attempted: Weight Watchers  Current Exercise Habits walking fives times per week 1-2 miles  Current Eating Habits  Diet: Breakfast: whole-grain toast with avocado.  Lunch: grilled chicken salad ,quinoa bowl with beans and roasted vegetables.  Dinner: baked salmon with steamed broccoli   Number of regular meals per day: 3 Number of snacking episodes per day: 2 Who shops for food? patient and wife Who prepares food? patient and wife Who eats with patient? patient Binge behavior?: Yes Purge behavior? no Anorexic behavior? no Eating precipitated by stress? yes  Guilt feelings associated with eating? yes   Other Potential Contributing Factors Use of alcohol: average 1 drinks/week Use of medications that may cause weight gain none History of past abuse? none Psych History: depression, obesity, sleep disturbance, and tobacco use Comorbidities: Prediabetes dyslipidemia and hypertension      Review of Systems  Constitutional:   Negative for chills and fever.  Eyes:  Negative for blurred vision.  Respiratory:  Negative for shortness of breath.   Cardiovascular:  Negative for palpitations.  Neurological:  Negative for dizziness and headaches.      Objective:     BP 135/88 (BP Location: Right Arm, Patient Position: Sitting, Cuff Size: Large)   Pulse 82   Wt (!) 370 lb (167.8 kg)   SpO2 95%   BMI 48.82 kg/m  BP Readings from Last 3 Encounters:  12/12/23 135/88  12/10/23 (!) 144/94  12/08/23 (!) 148/90      Physical Exam Vitals reviewed.  Constitutional:      General: He is not in acute distress.    Appearance: Normal appearance. He is not ill-appearing, toxic-appearing or diaphoretic.  HENT:     Head: Normocephalic.  Eyes:     General:        Right eye: No discharge.        Left eye: No discharge.     Conjunctiva/sclera: Conjunctivae normal.  Cardiovascular:     Rate and Rhythm: Normal rate.     Pulses: Normal pulses.     Heart sounds: Normal heart sounds.  Pulmonary:     Effort: Pulmonary effort is normal. No respiratory distress.     Breath sounds: Normal breath sounds.  Skin:    General: Skin is warm and dry.  Neurological:     Mental Status: He is alert.  Psychiatric:        Mood and Affect: Mood normal.        Behavior: Behavior normal.      No results found for any visits on 12/12/23.  The 10-year ASCVD  risk score (Arnett DK, et al., 2019) is: 16%    Assessment & Plan:  Class 3 severe obesity due to excess calories with serious comorbidity and body mass index (BMI) of 45.0 to 49.9 in adult -     Semaglutide -Weight Management; Inject 0.25 mg into the skin once a week for 28 days.  Dispense: 2 mL; Refill: 0  Class 3 severe obesity with serious comorbidity and body mass index (BMI) of 45.0 to 49.9 in adult Assessment & Plan: Trial on Wegovy  0.25 mg weekly Discussed Eat a Balanced Diet: Focus on whole, nutrient-dense foods like lean proteins, vegetables, fruits, whole grains,  and healthy fats while avoiding processed and sugary foods. Stay Active: Incorporate at least 30 minutes of moderate physical activity most days of the week, such as walking, jogging, or strength training. Hydrate and Rest: Drink plenty of water throughout the day and ensure you get 7-9 hours of quality sleep each night to support metabolism and recovery. Practice Portion Control: Use smaller plates, measure portions, and eat mindfully to avoid overeating and manage calorie intake effectively.  Patient agreed to the plan of care   Other orders -     Nitroglycerin ; Place 1 tablet (0.4 mg total) under the tongue every 5 (five) minutes as needed for chest pain.  Dispense: 50 tablet; Refill: 3    Return in about 4 months (around 04/12/2024), or if symptoms worsen or fail to improve, for chronic follow-up.   Hilario Kidd Wilhelmena Falter, FNP

## 2023-12-16 ENCOUNTER — Telehealth: Payer: Self-pay | Admitting: Pharmacy Technician

## 2023-12-16 ENCOUNTER — Other Ambulatory Visit (HOSPITAL_COMMUNITY): Payer: Self-pay

## 2023-12-16 NOTE — Telephone Encounter (Signed)
 Case # [id number]

## 2023-12-16 NOTE — Telephone Encounter (Signed)
 Pharmacy Patient Advocate Encounter   Received notification from CoverMyMeds that prior authorization for Wegovy  0.25MG /0.5ML auto-injectors is required/requested.   Insurance verification completed.   The patient is insured through [employer] .   Per test claim: PA required; PA submitted to above mentioned insurance via Latent Key/confirmation #/EOC [id number] Status is pending

## 2023-12-16 NOTE — Telephone Encounter (Signed)
 Pharmacy Patient Advocate Encounter  Received notification from [facility] MEDICAID that Prior Authorization for Wegovy  0.25MG /0.5ML auto-injectors has been APPROVED from 12/16/2023 to 06/17/2024. Ran test claim, Copay is $4.00. This test claim was processed through [facility]- copay amounts may vary at other pharmacies due to pharmacy/plan contracts, or as the patient moves through the different stages of their insurance plan.   PA #/Case ID/Reference #: Wegovy  0.25MG /0.5ML auto-injectors

## 2023-12-17 ENCOUNTER — Ambulatory Visit (INDEPENDENT_AMBULATORY_CARE_PROVIDER_SITE_OTHER): Admitting: Neurology

## 2023-12-17 DIAGNOSIS — Z9189 Other specified personal risk factors, not elsewhere classified: Secondary | ICD-10-CM

## 2023-12-17 DIAGNOSIS — R0683 Snoring: Secondary | ICD-10-CM | POA: Diagnosis not present

## 2023-12-17 DIAGNOSIS — G4719 Other hypersomnia: Secondary | ICD-10-CM

## 2023-12-17 DIAGNOSIS — G4720 Circadian rhythm sleep disorder, unspecified type: Secondary | ICD-10-CM

## 2023-12-17 DIAGNOSIS — R351 Nocturia: Secondary | ICD-10-CM

## 2023-12-17 DIAGNOSIS — R6889 Other general symptoms and signs: Secondary | ICD-10-CM

## 2023-12-17 DIAGNOSIS — R030 Elevated blood-pressure reading, without diagnosis of hypertension: Secondary | ICD-10-CM

## 2023-12-17 DIAGNOSIS — G4734 Idiopathic sleep related nonobstructive alveolar hypoventilation: Secondary | ICD-10-CM

## 2023-12-17 DIAGNOSIS — E349 Endocrine disorder, unspecified: Secondary | ICD-10-CM

## 2023-12-17 DIAGNOSIS — G4733 Obstructive sleep apnea (adult) (pediatric): Secondary | ICD-10-CM

## 2023-12-19 ENCOUNTER — Ambulatory Visit: Admitting: Family Medicine

## 2023-12-24 NOTE — Procedures (Unsigned)
 Physician Interpretation: Please see link under Procedure Tab or under Encounters tab for physician report, technical report, as well as O2 titration and/or PAP titration tables (if applicable).   Referred by: Terry Wilhelmena Lloyd Hilario, FNP    History and Indication for Testing (obtained from visit note dated 11/06/2023): 53 year old male with an underlying medical history of hypertension, testosterone  deficiency, impaired fasting glucose, vitamin D  deficiency, hyperlipidemia, depression, and morbid obesity with a BMI of over 45, who reports snoring and excessive daytime somnolence. His Epworth sleepiness score is 7 out of 24, fatigue severity score is 43 out of 63. He is currently out of work. He is a [employer]. 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    Review of the EEG showed no abnormal electrical discharges and symmetrical bihemispheric findings.     EKG: The EKG revealed normal sinus rhythm (NSR). ***   AUDIO/VIDEO REVIEW: The audio and video review did not show any abnormal or unusual behaviors, movements, phonations or vocalizations. The patient took *** restroom breaks. Snoring was noted, ***   POST-STUDY QUESTIONNAIRE: Post study, the patient indicated, that sleep was *** the same as usual.    IMPRESSION:    Obstructive Sleep Apnea (OSA), *** ***Central Sleep Apnea (CSA) ***Primary Snoring ***Primary Central Sleep Apnea ***Complex Sleep Apnea ***PLMD (periodic limb movement disorder [of sleep]) ***Dysfunctions associated with sleep stages or arousal from sleep ***Non-specific abnormal electrocardiogram (EKG) ***Poor sleep pattern ***Inconclusive Test   RECOMMENDATIONS:         I certify that I have reviewed the entire raw data recording prior to the issuance of this report in accordance with the Standards of Accreditation of the American Academy of Sleep Medicine (AASM).   True Mar, MD, PhD Medical Director, [facility]  [facility]) Diplomat, ABPN (Neurology and Sleep)

## 2023-12-26 ENCOUNTER — Ambulatory Visit: Payer: Self-pay | Admitting: Neurology

## 2023-12-26 DIAGNOSIS — G4734 Idiopathic sleep related nonobstructive alveolar hypoventilation: Secondary | ICD-10-CM

## 2023-12-26 DIAGNOSIS — G4733 Obstructive sleep apnea (adult) (pediatric): Secondary | ICD-10-CM

## 2023-12-29 ENCOUNTER — Telehealth: Payer: Self-pay

## 2023-12-29 ENCOUNTER — Telehealth: Admitting: Physician Assistant

## 2023-12-29 DIAGNOSIS — J069 Acute upper respiratory infection, unspecified: Secondary | ICD-10-CM | POA: Diagnosis not present

## 2023-12-29 DIAGNOSIS — B9689 Other specified bacterial agents as the cause of diseases classified elsewhere: Secondary | ICD-10-CM

## 2023-12-29 MED ORDER — PROMETHAZINE-DM 6.25-15 MG/5ML PO SYRP: 5.0000 mL | ORAL_SOLUTION | Freq: Four times a day (QID) | ORAL | 0 refills | Status: AC | PRN

## 2023-12-29 MED ORDER — ALBUTEROL SULFATE HFA 108 (90 BASE) MCG/ACT IN AERS: 1.0000 | INHALATION_SPRAY | Freq: Four times a day (QID) | RESPIRATORY_TRACT | 0 refills | Status: AC | PRN

## 2023-12-29 MED ORDER — AZITHROMYCIN 250 MG PO TABS: ORAL_TABLET | ORAL | 0 refills | Status: AC

## 2023-12-29 NOTE — Telephone Encounter (Signed)
 Copied from CRM [id number]. Topic: Clinical - Lab/Test Results >> Dec 26, 2023  3:11 PM Bruce Koch wrote: Reason for CRM: Patient is calling in because he received his test results from his sleep study and he doesn't understand what they mean. Patient is requesting someone call him to explain.

## 2023-12-29 NOTE — Patient Instructions (Signed)
 Bruce Koch, thank you for joining Delon CHRISTELLA Dickinson, PA-C for today's virtual visit.  While this provider is not your primary care provider (PCP), if your PCP is located in our provider database this encounter information will be shared with them immediately following your visit.   A [facility] MyChart account gives you access to today's visit and all your visits, tests, and labs performed at [facility]  click here if you don't have a [facility] MyChart account or go to [email or web address]  Consent: (Patient) Bruce Koch provided verbal consent for this virtual visit at the beginning of the encounter.  Current Medications:  Current Outpatient Medications:    albuterol  (VENTOLIN  HFA) 108 (90 Base) MCG/ACT inhaler, Inhale 1-2 puffs into the lungs every 6 (six) hours as needed., Disp: 8 g, Rfl: 0   azithromycin  (ZITHROMAX ) 250 MG tablet, Take 2 tablets on day 1, then 1 tablet daily on days 2 through 5, Disp: 6 tablet, Rfl: 0   promethazine -dextromethorphan (PROMETHAZINE -DM) 6.25-15 MG/5ML syrup, Take 5 mLs by mouth 4 (four) times daily as needed., Disp: 118 mL, Rfl: 0   amLODipine  (NORVASC ) 10 MG tablet, TAKE 1 TABLET(10 MG) BY MOUTH DAILY, Disp: 90 tablet, Rfl: 0   cyclobenzaprine  (FLEXERIL ) 10 MG tablet, Take 1 tablet (10 mg total) by mouth 2 (two) times daily as needed for muscle spasms. (Patient not taking: Reported on 12/12/2023), Disp: 10 tablet, Rfl: 0   metFORMIN  (GLUCOPHAGE ) 500 MG tablet, Take 1 tablet (500 mg total) by mouth daily with breakfast., Disp: 90 tablet, Rfl: 1   Multiple Vitamins-Minerals (MULTIVITAMIN MEN PO), Take 1 tablet by mouth daily., Disp: , Rfl:    nitroGLYCERIN  (NITROSTAT ) 0.4 MG SL tablet, Place 1 tablet (0.4 mg total) under the tongue every 5 (five) minutes as needed for chest pain., Disp: 50 tablet, Rfl: 3   rosuvastatin  (CRESTOR ) 20 MG tablet, TAKE 1 TABLET(20 MG) BY MOUTH DAILY, Disp: 90 tablet, Rfl: 3    semaglutide -weight management (WEGOVY ) 0.25 MG/0.5ML SOAJ SQ injection, Inject 0.25 mg into the skin once a week for 28 days., Disp: 2 mL, Rfl: 0   tamsulosin (FLOMAX) 0.4 MG CAPS capsule, Take 0.4 mg by mouth daily. (Patient not taking: Reported on 12/12/2023), Disp: , Rfl:    valsartan  (DIOVAN ) 160 MG tablet, Take 1 tablet (160 mg total) by mouth daily., Disp: 30 tablet, Rfl: 1   Medications ordered in this encounter:  Meds ordered this encounter  Medications   azithromycin  (ZITHROMAX ) 250 MG tablet    Sig: Take 2 tablets on day 1, then 1 tablet daily on days 2 through 5    Dispense:  6 tablet    Refill:  0    Supervising Provider:   LAMPTEY, PHILIP O [[id number]]   promethazine -dextromethorphan (PROMETHAZINE -DM) 6.25-15 MG/5ML syrup    Sig: Take 5 mLs by mouth 4 (four) times daily as needed.    Dispense:  118 mL    Refill:  0    Supervising Provider:   LAMPTEY, PHILIP O [id number]   albuterol  (VENTOLIN  HFA) 108 (90 Base) MCG/ACT inhaler    Sig: Inhale 1-2 puffs into the lungs every 6 (six) hours as needed.    Dispense:  8 g    Refill:  0    Supervising Provider:   BLAISE ALEENE KIDD [[id number]]     *If you need refills on other medications prior to your next appointment, please contact your pharmacy*  Follow-Up: Call back or seek an in-person evaluation if the symptoms  worsen or if the condition fails to improve as anticipated.  [facility] [phone]  Other Instructions Upper Respiratory Infection, Adult An upper respiratory infection (URI) is a common viral infection of the nose, throat, and upper air passages that lead to the lungs. The most common type of URI is the common cold. URIs usually get better on their own, without medical treatment. What are the causes? A URI is caused by a virus. You may catch a virus by: Breathing in droplets from an infected person's cough or sneeze. Touching something that has been exposed to the virus (is contaminated) and then  touching your mouth, nose, or eyes. What increases the risk? You are more likely to get a URI if: You are very young or very old. You have close contact with others, such as at work, school, or a health care facility. You smoke. You have long-term (chronic) heart or lung disease. You have a weakened disease-fighting system (immune system). You have nasal allergies or asthma. You are experiencing a lot of stress. You have poor nutrition. What are the signs or symptoms? A URI usually involves some of the following symptoms: Runny or stuffy (congested) nose. Cough. Sneezing. Sore throat. Headache. Fatigue. Fever. Loss of appetite. Pain in your forehead, behind your eyes, and over your cheekbones (sinus pain). Muscle aches. Redness or irritation of the eyes. Pressure in the ears or face. How is this diagnosed? This condition may be diagnosed based on your medical history and symptoms, and a physical exam. Your health care provider may use a swab to take a mucus sample from your nose (nasal swab). This sample can be tested to determine what virus is causing the illness. How is this treated? URIs usually get better on their own within 7-10 days. Medicines cannot cure URIs, but your health care provider may recommend certain medicines to help relieve symptoms, such as: Over-the-counter cold medicines. Cough suppressants. Coughing is a type of defense against infection that helps to clear the respiratory system, so take these medicines only as recommended by your health care provider. Fever-reducing medicines. Follow these instructions at home: Activity Rest as needed. If you have a fever, stay home from work or school until your fever is gone or until your health care provider says your URI cannot spread to other people (is no longer contagious). Your health care provider may have you wear a face mask to prevent your infection from spreading. Relieving symptoms Gargle with a mixture of  salt and water 3-4 times a day or as needed. To make salt water, completely dissolve -1 tsp (3-6 g) of salt in 1 cup (237 mL) of warm water. Use a cool-mist humidifier to add moisture to the air. This can help you breathe more easily. Eating and drinking  Drink enough fluid to keep your urine pale yellow. Eat soups and other clear broths. General instructions  Take over-the-counter and prescription medicines only as told by your health care provider. These include cold medicines, fever reducers, and cough suppressants. Do not use any products that contain nicotine or tobacco. These products include cigarettes, chewing tobacco, and vaping devices, such as e-cigarettes. If you need help quitting, ask your health care provider. Stay away from secondhand smoke. Stay up to date on all immunizations, including the yearly (annual) flu vaccine. Keep all follow-up visits. This is important. How to prevent the spread of infection to others URIs can be contagious. To prevent the infection from spreading: Wash your hands with soap  and water for at least 20 seconds. If soap and water are not available, use hand sanitizer. Avoid touching your mouth, face, eyes, or nose. Cough or sneeze into a tissue or your sleeve or elbow instead of into your hand or into the air.  Contact a health care provider if: You are getting worse instead of better. You have a fever or chills. Your mucus is brown or red. You have yellow or brown discharge coming from your nose. You have pain in your face, especially when you bend forward. You have swollen neck glands. You have pain while swallowing. You have white areas in the back of your throat. Get help right away if: You have shortness of breath that gets worse. You have severe or persistent: Headache. Ear pain. Sinus pain. Chest pain. You have chronic lung disease along with any of the following: Making high-pitched whistling sounds when you breathe, most often  when you breathe out (wheezing). Prolonged cough (more than 14 days). Coughing up blood. A change in your usual mucus. You have a stiff neck. You have changes in your: Vision. Hearing. Thinking. Mood. These symptoms may be an emergency. Get help right away. Call 911. Do not wait to see if the symptoms will go away. Do not drive yourself to the hospital. Summary An upper respiratory infection (URI) is a common infection of the nose, throat, and upper air passages that lead to the lungs. A URI is caused by a virus. URIs usually get better on their own within 7-10 days. Medicines cannot cure URIs, but your health care provider may recommend certain medicines to help relieve symptoms. This information is not intended to replace advice given to you by your health care provider. Make sure you discuss any questions you have with your health care provider. Document Revised: 11/08/2020 Document Reviewed: 11/08/2020 Elsevier Patient Education  2024 Elsevier [employer].   If you have been instructed to have an in-person evaluation today at a local Urgent Care facility, please use the link below. It will take you to a list of all of our available [facility], including address, phone number and hours of operation. Please do not delay care.  [facility]  If you or a family member do not have a primary care provider, use the link below to schedule a visit and establish care. When you choose a [facility] primary care physician or advanced practice provider, you gain a long-term partner in health. Find a Primary Care Provider  Learn more about [facility]'s in-office and virtual care options: [facility] - Get Care Now

## 2023-12-29 NOTE — Progress Notes (Signed)
 Virtual Visit Consent   Bruce Koch, you are scheduled for a virtual visit with a [facility] provider today. Just as with appointments in the office, your consent must be obtained to participate. Your consent will be active for this visit and any virtual visit you may have with one of our providers in the next 365 days. If you have a MyChart account, a copy of this consent can be sent to you electronically.  As this is a virtual visit, video technology does not allow for your provider to perform a traditional examination. This may limit your provider's ability to fully assess your condition. If your provider identifies any concerns that need to be evaluated in person or the need to arrange testing (such as labs, EKG, etc.), we will make arrangements to do so. Although advances in technology are sophisticated, we cannot ensure that it will always work on either your end or our end. If the connection with a video visit is poor, the visit may have to be switched to a telephone visit. With either a video or telephone visit, we are not always able to ensure that we have a secure connection.  By engaging in this virtual visit, you consent to the provision of healthcare and authorize for your insurance to be billed (if applicable) for the services provided during this visit. Depending on your insurance coverage, you may receive a charge related to this service.  I need to obtain your verbal consent now. Are you willing to proceed with your visit today? Bruce Koch has provided verbal consent on 12/29/2023 for a virtual visit (video or telephone). Bruce CHRISTELLA Dickinson, PA-C  Date: 12/29/2023 3:04 PM   Virtual Visit via Video Note   I, Bruce Koch, connected with  Bruce Koch  ([id number], 11-Mar-1971) on 12/29/23 at  7:00 PM EDT by a video-enabled telemedicine application and verified that I am speaking with the correct person using two identifiers.  Location: Patient:  Virtual Visit Location Patient: Home Provider: Virtual Visit Location Provider: Home Office   I discussed the limitations of evaluation and management by telemedicine and the availability of in person appointments. The patient expressed understanding and agreed to proceed.    History of Present Illness: Bruce Koch is a 53 y.o. who identifies as a male who was assigned male at birth, and is being seen today for URI symptoms.  HPI: URI  This is a new problem. The current episode started in the past 7 days. The problem has been gradually worsening. Maximum temperature: subjective. The fever has been present for 1 to 2 days. Associated symptoms include congestion, coughing, diarrhea (past couple days), headaches and wheezing. Pertinent negatives include no ear pain, nausea, plugged ear sensation, rhinorrhea, sinus pain, sore throat or vomiting. Treatments tried: dayquil. The treatment provided no relief.     Problems:  Patient Active Problem List   Diagnosis Date Noted   Hypogonadism, male 12/10/2023   STD (male) 12/09/2023   Mixed hyperlipidemia 10/23/2023   Erythrocytosis 10/23/2023   Sleep apnea 10/15/2023   Right foot pain 11/20/2022   Prediabetes 10/17/2022   Urinary frequency 08/09/2014   GERD (gastroesophageal reflux disease) 08/09/2014   Tinea pedis 08/09/2014   Essential hypertension, benign 06/07/2014   Class 3 severe obesity with serious comorbidity and body mass index (BMI) of 45.0 to 49.9 in adult 06/07/2014   Erectile dysfunction 06/07/2014   Depression 06/07/2014   Onychomycosis 06/07/2014   Tinea cruris 06/07/2014   Cutaneous skin  tags 06/07/2014   History of alcohol abuse 06/07/2014   History of cocaine abuse (HCC) 06/07/2014   Current smoker 06/07/2014    Allergies: No Known Allergies Medications:  Current Outpatient Medications:    albuterol  (VENTOLIN  HFA) 108 (90 Base) MCG/ACT inhaler, Inhale 1-2 puffs into the lungs every 6 (six) hours as needed.,  Disp: 8 g, Rfl: 0   azithromycin  (ZITHROMAX ) 250 MG tablet, Take 2 tablets on day 1, then 1 tablet daily on days 2 through 5, Disp: 6 tablet, Rfl: 0   promethazine -dextromethorphan (PROMETHAZINE -DM) 6.25-15 MG/5ML syrup, Take 5 mLs by mouth 4 (four) times daily as needed., Disp: 118 mL, Rfl: 0   amLODipine  (NORVASC ) 10 MG tablet, TAKE 1 TABLET(10 MG) BY MOUTH DAILY, Disp: 90 tablet, Rfl: 0   cyclobenzaprine  (FLEXERIL ) 10 MG tablet, Take 1 tablet (10 mg total) by mouth 2 (two) times daily as needed for muscle spasms. (Patient not taking: Reported on 12/12/2023), Disp: 10 tablet, Rfl: 0   metFORMIN  (GLUCOPHAGE ) 500 MG tablet, Take 1 tablet (500 mg total) by mouth daily with breakfast., Disp: 90 tablet, Rfl: 1   Multiple Vitamins-Minerals (MULTIVITAMIN MEN PO), Take 1 tablet by mouth daily., Disp: , Rfl:    nitroGLYCERIN  (NITROSTAT ) 0.4 MG SL tablet, Place 1 tablet (0.4 mg total) under the tongue every 5 (five) minutes as needed for chest pain., Disp: 50 tablet, Rfl: 3   rosuvastatin  (CRESTOR ) 20 MG tablet, TAKE 1 TABLET(20 MG) BY MOUTH DAILY, Disp: 90 tablet, Rfl: 3   semaglutide -weight management (WEGOVY ) 0.25 MG/0.5ML SOAJ SQ injection, Inject 0.25 mg into the skin once a week for 28 days., Disp: 2 mL, Rfl: 0   tamsulosin (FLOMAX) 0.4 MG CAPS capsule, Take 0.4 mg by mouth daily. (Patient not taking: Reported on 12/12/2023), Disp: , Rfl:    valsartan  (DIOVAN ) 160 MG tablet, Take 1 tablet (160 mg total) by mouth daily., Disp: 30 tablet, Rfl: 1  Observations/Objective: Patient is well-developed, well-nourished in no acute distress.  Resting comfortably at home.  Head is normocephalic, atraumatic.  No labored breathing.  Speech is clear and coherent with logical content.  Patient is alert and oriented at baseline.    Assessment and Plan: 1. Bacterial upper respiratory infection (Primary) - azithromycin  (ZITHROMAX ) 250 MG tablet; Take 2 tablets on day 1, then 1 tablet daily on days 2 through 5   Dispense: 6 tablet; Refill: 0 - promethazine -dextromethorphan (PROMETHAZINE -DM) 6.25-15 MG/5ML syrup; Take 5 mLs by mouth 4 (four) times daily as needed.  Dispense: 118 mL; Refill: 0 - albuterol  (VENTOLIN  HFA) 108 (90 Base) MCG/ACT inhaler; Inhale 1-2 puffs into the lungs every 6 (six) hours as needed.  Dispense: 8 g; Refill: 0  - Worsening over a week despite OTC medications - Will treat with Z-pack, Promethazine  DM, and Albuterol  inhaler - Can add Mucinex   - Push fluids.  - Rest.  - Steam and humidifier can help - Seek in person evaluation if worsening or symptoms fail to improve    Follow Up Instructions: I discussed the assessment and treatment plan with the patient. The patient was provided an opportunity to ask questions and all were answered. The patient agreed with the plan and demonstrated an understanding of the instructions.  A copy of instructions were sent to the patient via MyChart unless otherwise noted below.    The patient was advised to call back or seek an in-person evaluation if the symptoms worsen or if the condition fails to improve as anticipated.    Bruce HERO  Kimm Ungaro, PA-C

## 2023-12-30 NOTE — Telephone Encounter (Signed)
 Pt called to get nurse to call to explain result that were placed on MyCHART .

## 2023-12-30 NOTE — Telephone Encounter (Signed)
 I recommend treatment with AutoPap therapy as previously explained.  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

## 2024-01-01 ENCOUNTER — Telehealth: Payer: Self-pay | Admitting: Neurology

## 2024-01-01 NOTE — Telephone Encounter (Signed)
 Pt is waiting on an approval from his insurance re: the CPAP.  Pt is asking for a call to discuss if he is a candidate for Inspire, please call.

## 2024-01-01 NOTE — Telephone Encounter (Addendum)
 Patient is not a candidate for Inspire. I called the pt and let him know. He verbalized understanding and will watch for call from [facility] re: autopap approval.

## 2024-01-03 LAB — LIPID PANEL
Chol/HDL Ratio: 3.2 ratio (ref 0.0–5.0)
Cholesterol, Total: 103 mg/dL (ref 100–199)
HDL: 32 mg/dL — ABNORMAL LOW (ref >39)
LDL Chol Calc (NIH): 49 mg/dL (ref 0–99)
Triglycerides: 124 mg/dL (ref 0–149)
VLDL Cholesterol Cal: 22 mg/dL (ref 5–40)

## 2024-01-03 LAB — CBC WITH DIFFERENTIAL/PLATELET
Basophils Absolute: 0.1 x10E3/uL (ref 0.0–0.2)
Basos: 1 %
EOS (ABSOLUTE): 0.2 x10E3/uL (ref 0.0–0.4)
Eos: 5 %
Hematocrit: 54 % — ABNORMAL HIGH (ref 37.5–51.0)
Hemoglobin: 17.6 g/dL (ref 13.0–17.7)
Immature Grans (Abs): 0 x10E3/uL (ref 0.0–0.1)
Immature Granulocytes: 0 %
Lymphocytes Absolute: 1.5 x10E3/uL (ref 0.7–3.1)
Lymphs: 39 %
MCH: 28.6 pg (ref 26.6–33.0)
MCHC: 32.6 g/dL (ref 31.5–35.7)
MCV: 88 fL (ref 79–97)
Monocytes Absolute: 0.4 x10E3/uL (ref 0.1–0.9)
Monocytes: 10 %
Neutrophils Absolute: 1.8 x10E3/uL (ref 1.4–7.0)
Neutrophils: 45 %
Platelets: 199 x10E3/uL (ref 150–450)
RBC: 6.15 x10E6/uL — ABNORMAL HIGH (ref 4.14–5.80)
RDW: 14.5 % (ref 11.6–15.4)
WBC: 3.9 x10E3/uL (ref 3.4–10.8)

## 2024-01-03 LAB — BMP8+EGFR
BUN/Creatinine Ratio: 19 (ref 9–20)
BUN: 20 mg/dL (ref 6–24)
CO2: 21 mmol/L (ref 20–29)
Calcium: 9 mg/dL (ref 8.7–10.2)
Chloride: 103 mmol/L (ref 96–106)
Creatinine, Ser: 1.08 mg/dL (ref 0.76–1.27)
Glucose: 134 mg/dL — ABNORMAL HIGH (ref 70–99)
Potassium: 4.7 mmol/L (ref 3.5–5.2)
Sodium: 143 mmol/L (ref 134–144)
eGFR: 83 mL/min/1.73 (ref >59)

## 2024-01-03 LAB — TESTOSTERONE: Testosterone: 339 ng/dL (ref 264–916)

## 2024-01-07 ENCOUNTER — Ambulatory Visit: Payer: Self-pay | Admitting: Family Medicine

## 2024-01-07 NOTE — Progress Notes (Signed)
 Please inform patient,  Kidney function normal.  Cholesterol levels within desired limits- Continue Crestor  20 mg once daily  Testosterone  levels normal

## 2024-01-08 ENCOUNTER — Ambulatory Visit: Admitting: "Endocrinology

## 2024-01-08 ENCOUNTER — Encounter: Payer: Self-pay | Admitting: "Endocrinology

## 2024-01-08 VITALS — BP 146/88 | HR 84 | Ht 73.0 in | Wt 362.2 lb

## 2024-01-08 DIAGNOSIS — R7303 Prediabetes: Secondary | ICD-10-CM

## 2024-01-08 DIAGNOSIS — D751 Secondary polycythemia: Secondary | ICD-10-CM | POA: Diagnosis not present

## 2024-01-08 DIAGNOSIS — I10 Essential (primary) hypertension: Secondary | ICD-10-CM

## 2024-01-08 DIAGNOSIS — E291 Testicular hypofunction: Secondary | ICD-10-CM

## 2024-01-08 DIAGNOSIS — E782 Mixed hyperlipidemia: Secondary | ICD-10-CM | POA: Diagnosis not present

## 2024-01-08 DIAGNOSIS — Z6841 Body Mass Index (BMI) 40.0 and over, adult: Secondary | ICD-10-CM

## 2024-01-08 LAB — CBC WITH DIFFERENTIAL/PLATELET
Basophils Absolute: 0 x10E3/uL (ref 0.0–0.2)
Basos: 1 %
EOS (ABSOLUTE): 0.2 x10E3/uL (ref 0.0–0.4)
Eos: 5 %
Hematocrit: 52.7 % — ABNORMAL HIGH (ref 37.5–51.0)
Hemoglobin: 17.7 g/dL (ref 13.0–17.7)
Immature Grans (Abs): 0 x10E3/uL (ref 0.0–0.1)
Immature Granulocytes: 0 %
Lymphocytes Absolute: 1.5 x10E3/uL (ref 0.7–3.1)
Lymphs: 38 %
MCH: 29.1 pg (ref 26.6–33.0)
MCHC: 33.6 g/dL (ref 31.5–35.7)
MCV: 87 fL (ref 79–97)
Monocytes Absolute: 0.4 x10E3/uL (ref 0.1–0.9)
Monocytes: 10 %
Neutrophils Absolute: 1.8 x10E3/uL (ref 1.4–7.0)
Neutrophils: 46 %
Platelets: 197 x10E3/uL (ref 150–450)
RBC: 6.08 x10E6/uL — ABNORMAL HIGH (ref 4.14–5.80)
RDW: 14.3 % (ref 11.6–15.4)
WBC: 3.9 x10E3/uL (ref 3.4–10.8)

## 2024-01-08 LAB — TESTOSTERONE, FREE, TOTAL, SHBG
Sex Hormone Binding: 27.6 nmol/L (ref 19.3–76.4)
Testosterone, Free: 7.5 pg/mL (ref 7.2–24.0)
Testosterone: 317 ng/dL (ref 264–916)

## 2024-01-08 LAB — FOLLICLE STIMULATING HORMONE: FSH: 2.4 m[IU]/mL (ref 1.5–12.4)

## 2024-01-08 LAB — POCT GLYCOSYLATED HEMOGLOBIN (HGB A1C): HbA1c, POC (prediabetic range): 5.8 % (ref 5.7–6.4)

## 2024-01-08 LAB — LUTEINIZING HORMONE: LH: 3.5 m[IU]/mL (ref 1.7–8.6)

## 2024-01-08 LAB — PSA: Prostate Specific Ag, Serum: 0.5 ng/mL (ref 0.0–4.0)

## 2024-01-08 LAB — FERRITIN: Ferritin: 230 ng/mL (ref 30–400)

## 2024-01-08 LAB — PROLACTIN: Prolactin: 7.2 ng/mL (ref 3.6–25.2)

## 2024-01-08 NOTE — Progress Notes (Signed)
 01/08/2024, 3:10 PM   Endocrinology follow-up note  Subjective:    Patient ID: Bruce Koch, male    DOB: [birth year], PCP Bruce Wilhelmena Falter, Hilario, FNP   Past Medical History:  Diagnosis Date   Depression 12/21/2013   Hyperlipidemia    Hypertension    Toe fracture    History reviewed. No pertinent surgical history. Social History   Socioeconomic History   Marital status: Married    Spouse name: Not on file   Number of children: 2   Years of education: 12    Highest education level: Not on file  Occupational History   Occupation: Unemployed   Tobacco Use   Smoking status: Every Day    Types: Cigars   Smokeless tobacco: Never   Tobacco comments:    He smokes Black and Milds Daily  Vaping Use   Vaping status: Never Used  Substance and Sexual Activity   Alcohol use: Yes    Comment: rarely    Drug use: Not Currently    Types: Marijuana, Cocaine   Sexual activity: Yes  Other Topics Concern   Not on file  Social History Narrative   Lives with wife (married since 2014) and 2 children age 19 and 97.    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, dog and Medical laboratory scientific officer   Social Drivers of Corporate investment banker Strain: Not on file  Food Insecurity: Not on file  Transportation Needs: Not on file  Physical Activity: Not on file  Stress: Not on file  Social Connections: Not on file   Family History  Problem Relation Age of Onset   Alcohol abuse Mother        history    Hypertension Mother    Diabetes Father    Drug abuse Father        cocaine overdose    Alcohol abuse Sister    Alcohol abuse Brother    Outpatient Encounter Medications as of 01/08/2024  Medication Sig   albuterol  (VENTOLIN  HFA) 108 (90 Base) MCG/ACT inhaler Inhale 1-2 puffs into the lungs every 6 (six) hours as needed.   amLODipine  (NORVASC ) 10 MG tablet TAKE 1 TABLET(10 MG) BY MOUTH DAILY   cyclobenzaprine  (FLEXERIL ) 10  MG tablet Take 1 tablet (10 mg total) by mouth 2 (two) times daily as needed for muscle spasms. (Patient not taking: Reported on 12/12/2023)   metFORMIN  (GLUCOPHAGE ) 500 MG tablet Take 1 tablet (500 mg total) by mouth daily with breakfast.   Multiple Vitamins-Minerals (MULTIVITAMIN MEN PO) Take 1 tablet by mouth daily.   nitroGLYCERIN  (NITROSTAT ) 0.4 MG SL tablet Place 1 tablet (0.4 mg total) under the tongue every 5 (five) minutes as needed for chest pain.   promethazine -dextromethorphan (PROMETHAZINE -DM) 6.25-15 MG/5ML syrup Take 5 mLs by mouth 4 (four) times daily as needed.   rosuvastatin  (CRESTOR ) 20 MG tablet TAKE 1 TABLET(20 MG) BY MOUTH DAILY   semaglutide -weight management (WEGOVY ) 0.25 MG/0.5ML SOAJ SQ injection Inject 0.25 mg into the skin once a week for 28 days. (Patient not taking: Reported on 01/08/2024)   tamsulosin (FLOMAX) 0.4 MG CAPS capsule Take 0.4 mg by mouth daily. (Patient not taking: Reported  on 12/12/2023)   valsartan  (DIOVAN ) 160 MG tablet Take 1 tablet (160 mg total) by mouth daily.   No facility-administered encounter medications on file as of 01/08/2024.   ALLERGIES: No Known Allergies  VACCINATION STATUS: Immunization History  Administered Date(s) Administered   Tdap 10/17/2011, 09/04/2021    HPI Bruce Koch is 53 y.o. male who presents today with a medical history as above. he is being seen in follow-up after he was seen in consultation for hypogonadism requested by Bruce Wilhelmena Lloyd Sola, FNP.  History is obtained from the patient as well as chart review.  Patient is not an optimal historian. Due to fatigue, low libido he was offered baseline testosterone  measurement in June 2025 when he was found to have total testosterone  of 294 and low free testosterone  of 4.0.   -His history is also significant for erythrocytosis with high RBC and high hematocrit.  His repeat previsit labs show normal level of testosterone  at 317 consistent with normal  gonadotropins, normal SHBG and prolactin. This is several weeks after patient himself off of the various over-the-counter energy boosting products he was getting from [employer].     Patient struggles with overweight/obesity for most of his adult life with prediabetes, with point-of-care A1c 5.8% improving from 6%.    He has recently completed a sleep study -which confirmed obstructive sleep apnea, however patient understands that is only low low oxygen.  He denies any history of testicular injury, radiation, nor chemotherapy. He denies any significant history of head injury.  He has 2 children and ages 51 and 52. His history also shows several prescriptions for hydrocodone , steroids for various reasons. He is other medical problems include morbid obesity, hypertension and hyperlipidemia on treatment.  He is a smoker.  He would like to have workup and treatment for hypogonadism and would like to achieve some weight loss. Patient denies history of coronary disease, CVA, nor history of coagulopathy.   He is a sedentary at baseline.  He does not follow any particular diet or exercise program. - He was given a prescription for Wegovy , however patient did not start taking it because he is concerned about side effects.  Review of Systems  Constitutional: + Lost 11 pounds in the last 4 weeks.  + fatigue, no subjective hyperthermia, no subjective hypothermia Eyes: no blurry vision, no xerophthalmia ENT: no sore throat, no nodules palpated in throat, no dysphagia/odynophagia, no hoarseness Cardiovascular: no Chest Pain, + exertional shortness of Breath, no palpitations, no leg swelling   Objective:       01/08/2024    2:05 PM 12/12/2023    1:38 PM 12/12/2023    1:32 PM  Vitals with BMI  Height 6' 1    Weight 362 lbs 3 oz 370 lbs 370 lbs  BMI 47.8 48.83 48.83  Systolic 146 135 851  Diastolic 88 88 98  Pulse 84 82 88    BP (!) 146/88   Pulse 84   Ht 6' 1 (1.854 m)   Wt (!)  362 lb 3.2 oz (164.3 kg)   BMI 47.79 kg/m   Wt Readings from Last 3 Encounters:  01/08/24 (!) 362 lb 3.2 oz (164.3 kg)  12/12/23 (!) 370 lb (167.8 kg)  12/10/23 (!) 373 lb 9.6 oz (169.5 kg)    Physical Exam  Constitutional:  Body mass index is 47.79 kg/m.,  not in acute distress, normal state of mind Eyes: PERRLA, EOMI, no exophthalmos ENT: moist mucous membranes, no gross thyromegaly, no gross  cervical lymphadenopathy   Genital: Testicles are 25 cc bilaterally, no intrascrotal mass lesion.  No gross femoral or inguinal hernia.  Male escutcheon, uncircumcised. Neurological: no tremor with outstretched hands, Deep tendon reflexes normal in bilateral lower extremities.  CMP ( most recent) CMP     Component Value Date/Time   NA 143 01/02/2024 0835   K 4.7 01/02/2024 0835   CL 103 01/02/2024 0835   CO2 21 01/02/2024 0835   GLUCOSE 134 (H) 01/02/2024 0835   GLUCOSE 108 (H) 04/24/2015 2259   BUN 20 01/02/2024 0835   CREATININE 1.08 01/02/2024 0835   CREATININE 1.12 06/07/2014 1605   CALCIUM  9.0 01/02/2024 0835   PROT 6.7 10/15/2023 1057   ALBUMIN 4.2 10/15/2023 1057   AST 46 (H) 10/15/2023 1057   ALT 63 (H) 10/15/2023 1057   ALKPHOS 76 10/15/2023 1057   BILITOT <0.2 10/15/2023 1057   EGFR 83 01/02/2024 0835   GFRNONAA >60 04/24/2015 2259   GFRNONAA 80 06/07/2014 1605     Diabetic Labs (most recent): Lab Results  Component Value Date   HGBA1C 5.8 01/08/2024   HGBA1C 6.0 (H) 10/15/2023   HGBA1C 5.6 10/17/2022     Lipid Panel ( most recent) Lipid Panel     Component Value Date/Time   CHOL 103 01/02/2024 0835   TRIG 124 01/02/2024 0835   HDL 32 (L) 01/02/2024 0835   CHOLHDL 3.2 01/02/2024 0835   CHOLHDL 4.2 06/07/2014 1605   VLDL 77 (H) 06/07/2014 1605   LDLCALC 49 01/02/2024 0835   LABVLDL 22 01/02/2024 0835      Lab Results  Component Value Date   TSH 1.580 10/15/2023   TSH 0.983 07/19/2022   TSH 1.158 06/07/2014   FREET4 1.11 10/15/2023   FREET4 1.05  07/19/2022    Recent Results (from the past 2160 hours)  CMP14+EGFR     Status: Abnormal   Collection Time: 10/15/23 10:57 AM  Result Value Ref Range   Glucose 98 70 - 99 mg/dL   BUN 17 6 - 24 mg/dL   Creatinine, Ser 9.15 0.76 - 1.27 mg/dL   eGFR 894 >40 fO/fpw/8.26   BUN/Creatinine Ratio 20 9 - 20   Sodium 138 134 - 144 mmol/L   Potassium 4.2 3.5 - 5.2 mmol/L   Chloride 102 96 - 106 mmol/L   CO2 19 (L) 20 - 29 mmol/L   Calcium  9.3 8.7 - 10.2 mg/dL   Total Protein 6.7 6.0 - 8.5 g/dL   Albumin 4.2 3.8 - 4.9 g/dL   Globulin, Total 2.5 1.5 - 4.5 g/dL   Bilirubin Total <9.7 0.0 - 1.2 mg/dL   Alkaline Phosphatase 76 44 - 121 IU/L   AST 46 (H) 0 - 40 IU/L   ALT 63 (H) 0 - 44 IU/L  CBC with Differential/Platelet     Status: Abnormal   Collection Time: 10/15/23 10:57 AM  Result Value Ref Range   WBC 4.6 3.4 - 10.8 x10E3/uL   RBC 6.28 (H) 4.14 - 5.80 x10E6/uL   Hemoglobin 17.8 (H) 13.0 - 17.7 g/dL   Hematocrit 44.1 (H) 62.4 - 51.0 %   MCV 89 79 - 97 fL   MCH 28.3 26.6 - 33.0 pg   MCHC 31.9 31.5 - 35.7 g/dL   RDW 86.4 88.3 - 84.5 %   Platelets 219 150 - 450 x10E3/uL   Neutrophils 52 Not Estab. %   Lymphs 34 Not Estab. %   Monocytes 9 Not Estab. %   Eos 4 Not  Estab. %   Basos 1 Not Estab. %   Neutrophils Absolute 2.4 1.4 - 7.0 x10E3/uL   Lymphocytes Absolute 1.6 0.7 - 3.1 x10E3/uL   Monocytes Absolute 0.4 0.1 - 0.9 x10E3/uL   EOS (ABSOLUTE) 0.2 0.0 - 0.4 x10E3/uL   Basophils Absolute 0.1 0.0 - 0.2 x10E3/uL   Immature Granulocytes 0 Not Estab. %   Immature Grans (Abs) 0.0 0.0 - 0.1 x10E3/uL  Lipid Profile     Status: None   Collection Time: 10/15/23 10:57 AM  Result Value Ref Range   Cholesterol, Total 144 100 - 199 mg/dL   Triglycerides 80 0 - 149 mg/dL   HDL 51 >60 mg/dL   VLDL Cholesterol Cal 15 5 - 40 mg/dL   LDL Chol Calc (NIH) 78 0 - 99 mg/dL   Chol/HDL Ratio 2.8 0.0 - 5.0 ratio    Comment:                                   T. Chol/HDL Ratio                                              Men  Women                               1/2 Avg.Risk  3.4    3.3                                   Avg.Risk  5.0    4.4                                2X Avg.Risk  9.6    7.1                                3X Avg.Risk 23.4   11.0   HgB A1c     Status: Abnormal   Collection Time: 10/15/23 10:57 AM  Result Value Ref Range   Hgb A1c MFr Bld 6.0 (H) 4.8 - 5.6 %    Comment:          Prediabetes: 5.7 - 6.4          Diabetes: >6.4          Glycemic control for adults with diabetes: <7.0    Est. average glucose Bld gHb Est-mCnc 126 mg/dL  TSH + free T4     Status: None   Collection Time: 10/15/23 10:57 AM  Result Value Ref Range   TSH 1.580 0.450 - 4.500 uIU/mL   Free T4 1.11 0.82 - 1.77 ng/dL  Vitamin D  (25 hydroxy)     Status: None   Collection Time: 10/15/23 11:00 AM  Result Value Ref Range   Vit D, 25-Hydroxy 39.2 30.0 - 100.0 ng/mL    Comment: Vitamin D  deficiency has been defined by the Institute of Medicine and an Endocrine Society practice guideline as a level of serum 25-OH vitamin D  less than 20 ng/mL (1,2). The Endocrine Society went on to further define vitamin D  insufficiency as a level  between 21 and 29 ng/mL (2). 1. IOM (Institute of Medicine). 2010. Dietary reference    intakes for calcium  and D. [place]: The    [employer]. 2. Holick MF, Binkley Bell, Bischoff-Ferrari HA, et al.    Evaluation, treatment, and prevention of vitamin D     deficiency: an Endocrine Society clinical practice    guideline. JCEM. 2011 Jul; 96(7):1911-30.   Testosterone ,Free and Total     Status: Abnormal   Collection Time: 10/15/23 11:00 AM  Result Value Ref Range   Testosterone  294 264 - 916 ng/dL    Comment: Adult male reference interval is based on a population of healthy nonobese males (BMI <30) between 59 and 54 years old. Travison, et.al. JCEM [phone]. PMID: 71675896.    Testosterone , Free 4.0 (L) 7.2 - 24.0 pg/mL  Urine Culture      Status: None   Collection Time: 12/08/23  2:20 PM   Specimen: Urine   UR  Result Value Ref Range   Urine Culture, Routine Final report    Organism ID, Bacteria Comment     Comment: Mixed urogenital flora Less than 10,000 colonies/mL   Chlamydia/Gonococcus/Trichomonas, NAA     Status: None   Collection Time: 12/08/23  2:20 PM   UR  Result Value Ref Range   Chlamydia by NAA Negative Negative   Gonococcus by NAA Negative Negative   Trich vag by NAA Negative Negative  Urinalysis     Status: Abnormal   Collection Time: 12/08/23  2:24 PM  Result Value Ref Range   Specific Gravity, UA >1.030 (H) 1.005 - 1.030   pH, UA 6.0 5.0 - 7.5   Color, UA Amber (A) Yellow   Appearance Ur Cloudy (A) Clear   Leukocytes,UA Negative Negative   Protein,UA 1+ (A) Negative/Trace   Glucose, UA Negative Negative   Ketones, UA Negative Negative   RBC, UA Negative Negative   Bilirubin, UA 1+ (A) Negative   Urobilinogen, Ur 0.2 0.2 - 1.0 mg/dL   Nitrite, UA Negative Negative  Testosterone      Status: None   Collection Time: 01/02/24  8:35 AM  Result Value Ref Range   Testosterone  339 264 - 916 ng/dL    Comment: Adult male reference interval is based on a population of healthy nonobese males (BMI <30) between 70 and 71 years old. Travison, et.al. JCEM [phone]. PMID: 71675896.   BMP8+eGFR     Status: Abnormal   Collection Time: 01/02/24  8:35 AM  Result Value Ref Range   Glucose 134 (H) 70 - 99 mg/dL   BUN 20 6 - 24 mg/dL   Creatinine, Ser 8.91 0.76 - 1.27 mg/dL   eGFR 83 >40 fO/fpw/8.26   BUN/Creatinine Ratio 19 9 - 20   Sodium 143 134 - 144 mmol/L   Potassium 4.7 3.5 - 5.2 mmol/L   Chloride 103 96 - 106 mmol/L   CO2 21 20 - 29 mmol/L   Calcium  9.0 8.7 - 10.2 mg/dL  Lipid Profile     Status: Abnormal   Collection Time: 01/02/24  8:35 AM  Result Value Ref Range   Cholesterol, Total 103 100 - 199 mg/dL   Triglycerides 875 0 - 149 mg/dL   HDL 32 (L) >60 mg/dL   VLDL Cholesterol Cal  22 5 - 40 mg/dL   LDL Chol Calc (NIH) 49 0 - 99 mg/dL   Chol/HDL Ratio 3.2 0.0 - 5.0 ratio    Comment:  T. Chol/HDL Ratio                                             Men  Women                               1/2 Avg.Risk  3.4    3.3                                   Avg.Risk  5.0    4.4                                2X Avg.Risk  9.6    7.1                                3X Avg.Risk 23.4   11.0   CBC with Differential/Platelet     Status: Abnormal   Collection Time: 01/02/24  8:35 AM  Result Value Ref Range   WBC 3.9 3.4 - 10.8 x10E3/uL   RBC 6.15 (H) 4.14 - 5.80 x10E6/uL   Hemoglobin 17.6 13.0 - 17.7 g/dL   Hematocrit 45.9 (H) 62.4 - 51.0 %   MCV 88 79 - 97 fL   MCH 28.6 26.6 - 33.0 pg   MCHC 32.6 31.5 - 35.7 g/dL   RDW 85.4 88.3 - 84.5 %   Platelets 199 150 - 450 x10E3/uL   Neutrophils 45 Not Estab. %   Lymphs 39 Not Estab. %   Monocytes 10 Not Estab. %   Eos 5 Not Estab. %   Basos 1 Not Estab. %   Neutrophils Absolute 1.8 1.4 - 7.0 x10E3/uL   Lymphocytes Absolute 1.5 0.7 - 3.1 x10E3/uL   Monocytes Absolute 0.4 0.1 - 0.9 x10E3/uL   EOS (ABSOLUTE) 0.2 0.0 - 0.4 x10E3/uL   Basophils Absolute 0.1 0.0 - 0.2 x10E3/uL   Immature Granulocytes 0 Not Estab. %   Immature Grans (Abs) 0.0 0.0 - 0.1 x10E3/uL  Prolactin     Status: None   Collection Time: 01/02/24  8:41 AM  Result Value Ref Range   Prolactin 7.2 3.6 - 25.2 ng/mL  Ferritin     Status: None   Collection Time: 01/02/24  8:41 AM  Result Value Ref Range   Ferritin 230 30 - 400 ng/mL  Luteinizing hormone     Status: None   Collection Time: 01/02/24  8:41 AM  Result Value Ref Range   LH 3.5 1.7 - 8.6 mIU/mL  Follicle stimulating hormone     Status: None   Collection Time: 01/02/24  8:41 AM  Result Value Ref Range   FSH 2.4 1.5 - 12.4 mIU/mL  CBC with Differential/Platelet     Status: Abnormal   Collection Time: 01/02/24  8:41 AM  Result Value Ref Range   WBC 3.9 3.4 - 10.8 x10E3/uL    RBC 6.08 (H) 4.14 - 5.80 x10E6/uL   Hemoglobin 17.7 13.0 - 17.7 g/dL   Hematocrit 47.2 (H) 62.4 - 51.0 %   MCV 87 79 - 97 fL   MCH 29.1 26.6 - 33.0 pg   MCHC 33.6 31.5 -  35.7 g/dL   RDW 85.6 88.3 - 84.5 %   Platelets 197 150 - 450 x10E3/uL   Neutrophils 46 Not Estab. %   Lymphs 38 Not Estab. %   Monocytes 10 Not Estab. %   Eos 5 Not Estab. %   Basos 1 Not Estab. %   Neutrophils Absolute 1.8 1.4 - 7.0 x10E3/uL   Lymphocytes Absolute 1.5 0.7 - 3.1 x10E3/uL   Monocytes Absolute 0.4 0.1 - 0.9 x10E3/uL   EOS (ABSOLUTE) 0.2 0.0 - 0.4 x10E3/uL   Basophils Absolute 0.0 0.0 - 0.2 x10E3/uL   Immature Granulocytes 0 Not Estab. %   Immature Grans (Abs) 0.0 0.0 - 0.1 x10E3/uL  PSA     Status: None   Collection Time: 01/02/24  8:41 AM  Result Value Ref Range   Prostate Specific Ag, Serum 0.5 0.0 - 4.0 ng/mL    Comment: Roche ECLIA methodology. According to the American Urological Association, Serum PSA should decrease and remain at undetectable levels after radical prostatectomy. The AUA defines biochemical recurrence as an initial PSA value 0.2 ng/mL or greater followed by a subsequent confirmatory PSA value 0.2 ng/mL or greater. Values obtained with different assay methods or kits cannot be used interchangeably. Results cannot be interpreted as absolute evidence of the presence or absence of malignant disease.   Testosterone , Free, Total, SHBG     Status: None   Collection Time: 01/02/24  8:41 AM  Result Value Ref Range   Testosterone  317 264 - 916 ng/dL    Comment: Adult male reference interval is based on a population of healthy nonobese males (BMI <30) between 49 and 77 years old. Travison, et.al. JCEM [phone]. PMID: 71675896.    Testosterone , Free 7.5 7.2 - 24.0 pg/mL   Sex Hormone Binding 27.6 19.3 - 76.4 nmol/L  HgB A1c     Status: None   Collection Time: 01/08/24  2:18 PM  Result Value Ref Range   Hemoglobin A1C     HbA1c POC (<> result, manual entry)     HbA1c,  POC (prediabetic range) 5.8 5.7 - 6.4 %   HbA1c, POC (controlled diabetic range)       Assessment & Plan:   1. Hypogonadism-resolved 2. Morbid obesity (HCC) 3. Essential hypertension, benign 4.  Hyperlipidemia 5.  Prediabetes 6.  Erythrocytosis   - I have reviewed his  new and available  records and clinically evaluated the patient. - Based on these reviews, he has metabolic syndrome indicated by morbid obesity,hyperlipidemia, hypertension, prediabetes; hx of mild hypogonadism . His recent labs did not confirm hypogonadism, but after he stopped taking over-the-counter testosterone  laced  energy boosters He is recently diagnosed with obstructive sleep apnea. He will not need testosterone  initiation at this time.  He is also unsuitable candidate for testosterone  treatment due to his erythrocytosis, obstructive sleep apnea. He will benefit the most from weight loss. He would have benefited from GLP-1 receptor agonist, however patient hesitant to take it because he is concerned about side effects.  His A1c is 5.8%, consistent with improving prediabetes.  He has prediabetes, advised to continue metformin  500 mg p.o. once a day is sufficient from this point of view.   - he made some changes after last visit on his diet and beverage habits, acknowledges that there is a room for improvement in his food and drink choices. - Suggestion is made for him to avoid simple carbohydrates  from his diet including Cakes, Sweet Desserts, Ice Cream, Soda (diet and regular), Sweet Tea,  Candies, Chips, Cookies, Store Bought Juices, Alcohol , Artificial Sweeteners,  Coffee Creamer, and Sugar-free Products, Lemonade. This will help patient to have more stable blood glucose profile and potentially avoid unintended weight gain.  -His blood pressure is not controlled to target. He is advised to continue Diovan  160 mg p.o. once a day, amlodipine  10 mg p.o. once a day.  For advised to continue his Crestor  20 mg  p.o. nightly.   - he is advised to maintain close follow up with Bruce Wilhelmena Lloyd Sola, FNP for primary care needs.   I spent  26  minutes in the care of the patient today including review of labs from Thyroid Function, CMP, and other relevant labs ; imaging/biopsy records (current and previous including abstractions from other facilities); face-to-face time discussing  his lab results and symptoms, medications doses, his options of short and long term treatment based on the latest standards of care / guidelines;   and documenting the encounter.  Bruce Koch  participated in the discussions, expressed understanding, and voiced agreement with the above plans.  All questions were answered to his satisfaction. he is encouraged to contact clinic should he have any questions or concerns prior to his return visit.   Follow up plan: Return in about 4 months (around 05/09/2024) for Fasting Labs  in AM B4 8.   Ranny Earl, MD [facility] [address] [place], [place] [address] Phone: [phone]  Fax: [phone]     01/08/2024, 3:10 PM  This note was partially dictated with voice recognition software. Similar sounding words can be transcribed inadequately or may not  be corrected upon review.

## 2024-01-09 ENCOUNTER — Ambulatory Visit: Payer: Self-pay | Admitting: Internal Medicine

## 2024-01-14 ENCOUNTER — Other Ambulatory Visit: Payer: Self-pay | Admitting: Internal Medicine

## 2024-01-14 ENCOUNTER — Other Ambulatory Visit: Payer: Self-pay | Admitting: Family Medicine

## 2024-01-14 ENCOUNTER — Other Ambulatory Visit: Payer: Self-pay

## 2024-01-14 DIAGNOSIS — I10 Essential (primary) hypertension: Secondary | ICD-10-CM

## 2024-01-14 MED ORDER — VALSARTAN 160 MG PO TABS: 160.0000 mg | ORAL_TABLET | Freq: Every day | ORAL | 1 refills | Status: DC

## 2024-01-15 ENCOUNTER — Encounter: Payer: Self-pay | Admitting: Neurology

## 2024-01-16 ENCOUNTER — Telehealth: Payer: Self-pay

## 2024-01-16 NOTE — Telephone Encounter (Signed)
Placed up front, pt aware.

## 2024-01-16 NOTE — Telephone Encounter (Signed)
   I can provide the letter regarding your blood pressure management but for the sleep study, You will need to contact neurology for management based on sleep study results.   To Whom It May Concern,  Bruce Koch DOB: [birth year]  This is to confirm that the above-named patient has hypertension, which is currently well controlled on the following regimen:  Valsartan  160 mg once daily  Amlodipine  10 mg once daily  If you require any additional information, please do not hesitate to contact our office.  Sincerely, Jannell Franta Del Wilhelmena Falter, MSN, APRN, FNP-C

## 2024-01-16 NOTE — Telephone Encounter (Signed)
 Copied from CRM [id number]. Topic: Appointments - Appointment Scheduling >> Jan 16, 2024  2:05 PM Geneva B wrote: Patient/patient representative is calling to schedule an appointment. Refer to attachments for appointment information.  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

## 2024-01-19 NOTE — Telephone Encounter (Signed)
 LVM for patient to call back and discuss letter for sleep study results

## 2024-01-19 NOTE — Telephone Encounter (Addendum)
 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 Dr Buck also had her recommendations for patient in sleep study results. Pt wants to change Neurologist due to  Dr Amelia recommendations.Informed patient has to go back to PCP for referral for another neurologist.Pt is  currently on pap machine

## 2024-01-22 ENCOUNTER — Telehealth: Payer: Self-pay | Admitting: Neurology

## 2024-01-22 NOTE — Telephone Encounter (Signed)
 Patient called to schedule Initial CPAP.  Informed patient appointment has already been scheduled on 03/10/24 at 3:15 pm. Ask patient for date pick up machine could not remember except was last week.  Patient said that date will be fine.

## 2024-01-27 ENCOUNTER — Other Ambulatory Visit: Payer: Self-pay | Admitting: Family Medicine

## 2024-01-27 ENCOUNTER — Ambulatory Visit: Admitting: Internal Medicine

## 2024-02-17 ENCOUNTER — Ambulatory Visit: Attending: Internal Medicine | Admitting: Internal Medicine

## 2024-02-17 NOTE — Progress Notes (Signed)
 Erroneous encounter - please disregard.

## 2024-03-10 ENCOUNTER — Encounter: Admitting: Neurology

## 2024-04-12 ENCOUNTER — Ambulatory Visit: Admitting: Family Medicine

## 2024-04-12 NOTE — Progress Notes (Deleted)
 SABRA

## 2024-04-13 ENCOUNTER — Other Ambulatory Visit: Payer: Self-pay | Admitting: Family Medicine

## 2024-04-13 ENCOUNTER — Telehealth: Payer: Self-pay | Admitting: Adult Health

## 2024-04-13 ENCOUNTER — Ambulatory Visit: Admitting: Adult Health

## 2024-04-13 NOTE — Telephone Encounter (Signed)
 Pt called to request  for appt to placed for tomorrow Pt was  having car trouble     Reschedule for tomorrow

## 2024-04-14 ENCOUNTER — Ambulatory Visit (INDEPENDENT_AMBULATORY_CARE_PROVIDER_SITE_OTHER): Admitting: Adult Health

## 2024-04-14 ENCOUNTER — Encounter: Payer: Self-pay | Admitting: Adult Health

## 2024-04-14 VITALS — BP 124/78 | HR 76 | Ht 72.0 in | Wt 334.0 lb

## 2024-04-14 DIAGNOSIS — G4733 Obstructive sleep apnea (adult) (pediatric): Secondary | ICD-10-CM | POA: Diagnosis not present

## 2024-04-14 NOTE — Patient Instructions (Signed)
 Continue using CPAP nightly and greater than 4 hours each night Order sent for new supplies Continue with weight loss. Will consider repeating home sleep test in the future If your symptoms worsen or you develop new symptoms please let us  know.

## 2024-04-14 NOTE — Progress Notes (Signed)
 "   PATIENT: Bruce Koch DOB: July 13, 1970  REASON FOR VISIT: follow up HISTORY FROM: patient PRIMARY NEUROLOGIST: Dr. Buck   HISTORY OF PRESENT ILLNESS: Today 04/14/2024  Bruce Koch is a 53 y.o. male with a history of obstructive sleep apnea. Returns today for follow-up.  This is his initial CPAP compliance visit.  His home sleep study showed overall mild sleep apnea.  He reports that he has noticed a benefit with the CPAP machine.  He states that he has noticed fatigue when he wakes up in the morning.  He states that he has lost weight.  According to our records approximately 30 pounds since his sleep study.  His download is below     REVIEW OF SYSTEMS: Out of a complete 14 system review of symptoms, the patient complains only of the following symptoms, and all other reviewed systems are negative.  FSS ESS  ALLERGIES: Allergies[1]  HOME MEDICATIONS: Outpatient Medications Prior to Visit  Medication Sig Dispense Refill   albuterol  (VENTOLIN  HFA) 108 (90 Base) MCG/ACT inhaler Inhale 1-2 puffs into the lungs every 6 (six) hours as needed. 8 g 0   amLODipine  (NORVASC ) 10 MG tablet TAKE 1 TABLET(10 MG) BY MOUTH DAILY 90 tablet 0   metFORMIN  (GLUCOPHAGE ) 500 MG tablet TAKE 1 TABLET(500 MG) BY MOUTH DAILY WITH BREAKFAST 90 tablet 1   Multiple Vitamins-Minerals (MULTIVITAMIN MEN PO) Take 1 tablet by mouth daily.     rosuvastatin  (CRESTOR ) 20 MG tablet TAKE 1 TABLET(20 MG) BY MOUTH DAILY 90 tablet 3   valsartan  (DIOVAN ) 160 MG tablet TAKE 1 TABLET(160 MG) BY MOUTH DAILY 90 tablet 1   cyclobenzaprine  (FLEXERIL ) 10 MG tablet Take 1 tablet (10 mg total) by mouth 2 (two) times daily as needed for muscle spasms. (Patient not taking: Reported on 04/14/2024) 10 tablet 0   nitroGLYCERIN  (NITROSTAT ) 0.4 MG SL tablet Place 1 tablet (0.4 mg total) under the tongue every 5 (five) minutes as needed for chest pain. 50 tablet 3   promethazine -dextromethorphan (PROMETHAZINE -DM)  6.25-15 MG/5ML syrup Take 5 mLs by mouth 4 (four) times daily as needed. 118 mL 0   tamsulosin (FLOMAX) 0.4 MG CAPS capsule Take 0.4 mg by mouth daily. (Patient not taking: Reported on 04/14/2024)     No facility-administered medications prior to visit.    PAST MEDICAL HISTORY: Past Medical History:  Diagnosis Date   Depression 12/21/2013   Hyperlipidemia    Hypertension    Toe fracture     PAST SURGICAL HISTORY: History reviewed. No pertinent surgical history.  FAMILY HISTORY: Family History  Problem Relation Age of Onset   Alcohol abuse Mother        history    Hypertension Mother    Diabetes Father    Drug abuse Father        cocaine overdose    Alcohol abuse Sister    Alcohol abuse Brother     SOCIAL HISTORY: Social History   Socioeconomic History   Marital status: Married    Spouse name: Not on file   Number of children: 2   Years of education: 12    Highest education level: Not on file  Occupational History   Occupation: Unemployed   Tobacco Use   Smoking status: Every Day    Types: Cigars   Smokeless tobacco: Never   Tobacco comments:    He smokes Black and Milds Daily  Vaping Use   Vaping status: Never Used  Substance and Sexual Activity   Alcohol  use: Not Currently    Comment: rarely    Drug use: Not Currently    Types: Marijuana, Cocaine   Sexual activity: Yes  Other Topics Concern   Not on file  Social History Narrative   Lives with wife (married since 2014) and 2 children age 2 and 39.    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, dog and cat   Patient is currently employed full time.    Social Drivers of Health   Tobacco Use: High Risk (04/14/2024)   Patient History    Smoking Tobacco Use: Every Day    Smokeless Tobacco Use: Never    Passive Exposure: Not on file  Financial Resource Strain: Not on file  Food Insecurity: Not on file  Transportation Needs: Not on file  Physical Activity: Not on file  Stress: Not on  file  Social Connections: Not on file  Intimate Partner Violence: Not on file  Depression (PHQ2-9): Low Risk (10/20/2023)   Depression (PHQ2-9)    PHQ-2 Score: 0  Recent Concern: Depression (PHQ2-9) - High Risk (10/15/2023)   Depression (PHQ2-9)    PHQ-2 Score: 12  Alcohol Screen: Not on file  Housing: Not on file  Utilities: Not on file  Health Literacy: Not on file      PHYSICAL EXAM  Vitals:   04/14/24 1010  BP: 124/78  Pulse: 76  Weight: (!) 334 lb (151.5 kg)  Height: 6' (1.829 m)   Body mass index is 45.3 kg/m.  Generalized: Well developed, in no acute distress  Chest: Lungs clear to auscultation bilaterally  Neurological examination  Mentation: Alert oriented to time, place, history taking. Follows all commands speech and language fluent Cranial nerve II-XII: Facial symmetry noted   DIAGNOSTIC DATA (LABS, IMAGING, TESTING) - I reviewed patient records, labs, notes, testing and imaging myself where available.  Lab Results  Component Value Date   WBC 3.9 01/02/2024   HGB 17.7 01/02/2024   HCT 52.7 (H) 01/02/2024   MCV 87 01/02/2024   PLT 197 01/02/2024      Component Value Date/Time   NA 143 01/02/2024 0835   K 4.7 01/02/2024 0835   CL 103 01/02/2024 0835   CO2 21 01/02/2024 0835   GLUCOSE 134 (H) 01/02/2024 0835   GLUCOSE 108 (H) 04/24/2015 2259   BUN 20 01/02/2024 0835   CREATININE 1.08 01/02/2024 0835   CREATININE 1.12 06/07/2014 1605   CALCIUM  9.0 01/02/2024 0835   PROT 6.7 10/15/2023 1057   ALBUMIN 4.2 10/15/2023 1057   AST 46 (H) 10/15/2023 1057   ALT 63 (H) 10/15/2023 1057   ALKPHOS 76 10/15/2023 1057   BILITOT <0.2 10/15/2023 1057   GFRNONAA >60 04/24/2015 2259   GFRNONAA 80 06/07/2014 1605   GFRAA >60 04/24/2015 2259   GFRAA >89 06/07/2014 1605   Lab Results  Component Value Date   CHOL 103 01/02/2024   HDL 32 (L) 01/02/2024   LDLCALC 49 01/02/2024   TRIG 124 01/02/2024   CHOLHDL 3.2 01/02/2024   Lab Results  Component Value  Date   HGBA1C 5.8 01/08/2024   No results found for: [id number] Lab Results  Component Value Date   TSH 1.580 10/15/2023      ASSESSMENT AND PLAN 53 y.o. year old male  has a past medical history of Depression (12/21/2013), Hyperlipidemia, Hypertension, and Toe fracture. here with:  OSA on CPAP  - CPAP compliance excellent - Good treatment of AHI  -  Encourage patient to use CPAP nightly and > 4 hours each night -Order sent for new supplies -With ongoing weight loss will consider home sleep test in the future - F/U in 6 months or sooner if needed   Orders Placed This Encounter  Procedures   For home use only DME continuous positive airway pressure (CPAP)    New supplies    Length of Need:   12 Months    Patient has OSA or probable OSA:   Yes    Is the patient currently using CPAP in the home:   Yes    Settings:   Other see comments    CPAP supplies needed:   Mask, headgear, cushions, filters, heated tubing and water chamber     Duwaine Russell, MSN, NP-C 04/14/2024, 10:17 AM [facility] [address] [place], [place] [address] [phone]      [1] No Known Allergies  "

## 2024-04-15 ENCOUNTER — Encounter: Payer: Self-pay | Admitting: Adult Health

## 2024-04-19 ENCOUNTER — Encounter: Payer: Self-pay | Admitting: Adult Health

## 2024-04-19 ENCOUNTER — Encounter: Payer: Self-pay | Admitting: *Deleted

## 2024-04-19 NOTE — Telephone Encounter (Signed)
 Pt called  to request for Letter to be sent to his email   [email or web address]    Pt is stating he is having a hard time getting in MyChart

## 2024-04-20 ENCOUNTER — Encounter: Payer: Self-pay | Admitting: Adult Health

## 2024-04-20 NOTE — Telephone Encounter (Signed)
 Pt called stating that wrong dates was on  Letter is possible  to fix dates

## 2024-04-20 NOTE — Telephone Encounter (Signed)
 Emailed letter to pt

## 2024-04-21 ENCOUNTER — Encounter: Payer: Self-pay | Admitting: Adult Health

## 2024-04-21 NOTE — Telephone Encounter (Signed)
 Revised letter and emailed it back to pt

## 2024-04-30 ENCOUNTER — Other Ambulatory Visit: Payer: Self-pay | Admitting: Internal Medicine

## 2024-05-11 ENCOUNTER — Ambulatory Visit: Admitting: "Endocrinology

## 2024-10-01 ENCOUNTER — Ambulatory Visit: Admitting: Adult Health

## 2024-10-12 ENCOUNTER — Telehealth: Admitting: Adult Health

## 2024-10-18 ENCOUNTER — Ambulatory Visit: Admitting: Adult Health
